# Patient Record
Sex: Female | Born: 1945 | Race: White | Hispanic: No | Marital: Married | Smoking: Never smoker
Health system: Southern US, Community
[De-identification: ages and names within clinical notes are randomized; demographics above are authoritative.]

## PROBLEM LIST (undated history)

## (undated) DIAGNOSIS — E039 Hypothyroidism, unspecified: Secondary | ICD-10-CM

## (undated) DIAGNOSIS — L509 Urticaria, unspecified: Secondary | ICD-10-CM

## (undated) DIAGNOSIS — B019 Varicella without complication: Secondary | ICD-10-CM

## (undated) DIAGNOSIS — K635 Polyp of colon: Secondary | ICD-10-CM

## (undated) DIAGNOSIS — L80 Vitiligo: Secondary | ICD-10-CM

## (undated) DIAGNOSIS — J45909 Unspecified asthma, uncomplicated: Secondary | ICD-10-CM

## (undated) HISTORY — DX: Hypothyroidism, unspecified: E03.9

## (undated) HISTORY — DX: Vitiligo: L80

## (undated) HISTORY — DX: Unspecified asthma, uncomplicated: J45.909

## (undated) HISTORY — PX: BREAST CYST ASPIRATION: SHX578

## (undated) HISTORY — PX: TONSILLECTOMY: SUR1361

## (undated) HISTORY — DX: Varicella without complication: B01.9

## (undated) HISTORY — DX: Urticaria, unspecified: L50.9

## (undated) HISTORY — DX: Polyp of colon: K63.5

---
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
## 1986-07-10 HISTORY — PX: BREAST BIOPSY: SHX20

## 1991-07-11 HISTORY — PX: PARTIAL HYSTERECTOMY: SHX80

## 2016-12-29 DIAGNOSIS — B351 Tinea unguium: Secondary | ICD-10-CM | POA: Diagnosis not present

## 2017-01-02 DIAGNOSIS — H524 Presbyopia: Secondary | ICD-10-CM | POA: Diagnosis not present

## 2017-02-05 DIAGNOSIS — L603 Nail dystrophy: Secondary | ICD-10-CM | POA: Diagnosis not present

## 2017-02-05 DIAGNOSIS — L821 Other seborrheic keratosis: Secondary | ICD-10-CM | POA: Diagnosis not present

## 2017-02-05 DIAGNOSIS — L718 Other rosacea: Secondary | ICD-10-CM | POA: Diagnosis not present

## 2017-02-06 NOTE — Progress Notes (Signed)
[place] at [facility] [address], [place], [place] [address] [phone]  Date:  02/08/2017   Name:  Maureen Obrien   DOB:  11-Apr-1946   MRN:  [id number]  PCP:  Darreld Mclean, MD    Chief Complaint: Establish Care (Pt here to est care. )   History of Present Illness:  Maureen Obrien is a 71 y.o. very pleasant female patient who presents with the following:  Here today as a new patient to my practice.  It looks like she had been a pt in [place] in the past- Dr. Teressa Lower.  Last OV with him about a year ago as follows:  ASSESSMENT/PLAN   1. Wellness examination - OV: routine/healthy lifestyle/cancer screening/vaccination education. Testing and referrals as noted. Repeat evalution yearly.   2. Screening for colon cancer - OV: routine education. increased risk. no symptoms. normal examination. Colonoscopy 2018. Recheck yearly.   3. Screening for breast cancer - OV: routine education. Risk: increased. Symptoms: no. Examination: normal. Mammogram due: now. Recheck yearly.   4. Screening for cervical cancer - N/A  5. Screening for osteoporosis - OV: routine education. Risk factor review. Maintain adequate calcium and vitamin D supplementation. Report broken bones. Bone density testing every 1-2 years, due now   6. Screening for lipid disorders - OV: report pending. Traditional goal for you is LDL 100 and HDL 50 but we are transitioning to a risk based approach for patients from ages 67-75. For these patients, we will also consider the calculated 10 risk of stroke and heart attack and the results of this calculation may lead to a recommendation for medication. Routine/medication/diet/exercise/weight maintenance education. If at goal, no changes and retest in TBD months.   7. Screening for diabetes mellitus (DM) - OV: at goal (fasting sugar less than 110) with sugar today pending. Routine education. Continue to  monitor.   8. Postmenopausal syndrome - OV: ongoing need for hormone replacement. routine education. Adverse effects reviewed. Elects to continue current dose with recheck 1 year.   9. Hypothyroidism, unspecified type - OV: thyroid dose seems to be correct based on symptoms and examination. Routine education. No changes. Retest now and recheck 12 months.   10. Varicose veins of both lower extremities - OV: evident both legs, but no symptoms - compression hose for now - consider vein clinic if worsens or symptoms  11. Vitiligo - OV: refer to dermatologist to continue care    [place]   Chief Complaint  Patient presents with  . Annual Exam   HPI   Current wellness status: - overall well, chronic diseases stable and /or routine care up to date, except for needing derm referral. - other wellness providers: none  Health maintenance status: Health Maintenance  Topic Date Due  . Medicare Annual Wellness Visit 11/23/1965  . MAMMOGRAM 11/23/1997  . PNEUMOVAX 11/23/2012  . INFLUENZA VACCINE 03/10/2016  . COLONOSCOPY 12/23/2023   Cancer screen status: - last mammogram: 2016 next due: now - last PAP smear: 2016 next due: na - last colonoscopy: 2015 next due: 2018  Vaccination status: - reviewed with patient Immunization History  Administered Date(s) Administered  . Pneumococcal Conjugate 13-valent - PREVNAR 01/21/2015  . Tdap 60/04/9322   HRT - uncertain reason for progestin, states feels better and is more balanced - states understands risk of HRT and breast cancer but wants to continue anyway - - THYROID - Notable interim history: none - Symptoms  overactive thyroid: None - Symptoms underactive thyroid: None - Medications: medication list: reviewed; compliance: good; adverse effects: none.   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 She understands that HRT does come with risks of several diseases- however she does not wish to stop using her HRT  No history of blood clot, CVA, heart issues or cancers  There is some family history of breast cancer- 2 of her younger sisters died of breast cancer, and her 2 brothers have had skin cancer (they live in [place] and have been exposed to heavy sun) She keeps up with her mammograms  She gets a colonoscopy every couple of years due to polyps- she has a GI doctor in [place] who she will continue to see She will continue getting her mammos in [place] as well Her last labs were in January   She would like to lose a little weight and wants to exercise more She has never been a smoker She does yoga and pilates.  She has been working a lot right now so she is not exercising as much  Her son and daughter live in [place] and [place]- she has 4 grands, between 44 and 29 yo   There are no active problems to display for this patient.   Past Medical History:  Diagnosis Date  . Asthma   . Chicken pox   . Colon polyps   . Hypothyroidism     Past Surgical History:  Procedure Laterality Date  . BREAST BIOPSY  1988  . PARTIAL HYSTERECTOMY  1993  . TONSILLECTOMY     childhood    Social History  Substance Use Topics  . Smoking status: Never Smoker  . Smokeless tobacco: Never Used  . Alcohol use Yes    Family History  Problem Relation Age of Onset  . Pancreatic cancer Mother   . Aneurysm Father   . Breast cancer Sister   . Skin cancer Brother   . Heart attack Brother   . Breast cancer Sister   . Hepatitis C Brother   . Skin cancer Brother     Allergies  Allergen Reactions  . Eggs Or Egg-Derived Products Other  (See Comments)  . Penicillin G Other (See Comments)    States since allergic to eggs can not use this    Medication list has been reviewed and updated.  No current outpatient prescriptions on file prior to visit.   No current facility-administered medications on file prior to visit.     Review of Systems:  As per HPI- otherwise negative.   Physical Examination: Vitals:   02/08/17 0945  BP: 101/78  Pulse: 73  Temp: 98.1 F (36.7 C)   Vitals:   02/08/17 0945  Weight: 147 lb 12.8 oz (67 kg)  Height: 5' 2.5" (1.588 m)   Body mass index is 26.6 kg/m. Ideal Body Weight: Weight in (lb) to have BMI = 25: 138.6  GEN: WDWN, NAD, Non-toxic, A & O x 3, mild overweight, looks well HEENT: Atraumatic, Normocephalic. Neck supple. No masses, No LAD. Bilateral TM  wnl, oropharynx normal.  PEERL,EOMI.   Ears and Nose: No external deformity. CV: RRR, No M/G/R. No JVD. No thrill. No extra heart sounds. PULM: CTA B, no wheezes, crackles, rhonchi. No retractions. No resp. distress. No accessory muscle use. EXTR: No c/c/e NEURO Normal gait.  PSYCH: Normally interactive. Conversant. Not depressed or anxious appearing.  Calm demeanor.    Assessment and Plan: Hypothyroidism (acquired)  Postmenopausal syndrome  Vitiligo  Here today to establish care She will see me for a physical in a few months  Here today to establish care as a new patient   Signed Lamar Blinks, MD

## 2017-02-07 ENCOUNTER — Telehealth: Payer: Self-pay | Admitting: Behavioral Health

## 2017-02-07 NOTE — Telephone Encounter (Signed)
Unable to reach patient at time of Pre-Visit Call.  Left message for patient to return call when available.    

## 2017-02-08 ENCOUNTER — Encounter: Payer: Self-pay | Admitting: Family Medicine

## 2017-02-08 ENCOUNTER — Ambulatory Visit (INDEPENDENT_AMBULATORY_CARE_PROVIDER_SITE_OTHER): Payer: PPO | Admitting: Family Medicine

## 2017-02-08 VITALS — BP 101/78 | HR 73 | Temp 98.1°F | Ht 62.5 in | Wt 147.8 lb

## 2017-02-08 DIAGNOSIS — N951 Menopausal and female climacteric states: Secondary | ICD-10-CM

## 2017-02-08 DIAGNOSIS — E039 Hypothyroidism, unspecified: Secondary | ICD-10-CM

## 2017-02-08 DIAGNOSIS — L80 Vitiligo: Secondary | ICD-10-CM | POA: Diagnosis not present

## 2017-02-08 NOTE — Patient Instructions (Addendum)
It was good to see you today- take care and please come and see me around the new year.  We can do your labs and physical at that time.

## 2017-03-09 DIAGNOSIS — M62830 Muscle spasm of back: Secondary | ICD-10-CM | POA: Diagnosis not present

## 2017-03-09 DIAGNOSIS — M545 Low back pain: Secondary | ICD-10-CM | POA: Diagnosis not present

## 2017-03-09 DIAGNOSIS — M9902 Segmental and somatic dysfunction of thoracic region: Secondary | ICD-10-CM | POA: Diagnosis not present

## 2017-03-09 DIAGNOSIS — M9903 Segmental and somatic dysfunction of lumbar region: Secondary | ICD-10-CM | POA: Diagnosis not present

## 2017-03-09 DIAGNOSIS — M542 Cervicalgia: Secondary | ICD-10-CM | POA: Diagnosis not present

## 2017-03-09 DIAGNOSIS — M9901 Segmental and somatic dysfunction of cervical region: Secondary | ICD-10-CM | POA: Diagnosis not present

## 2017-03-22 ENCOUNTER — Encounter: Payer: Self-pay | Admitting: Family Medicine

## 2017-04-05 ENCOUNTER — Other Ambulatory Visit: Payer: Self-pay | Admitting: Family Medicine

## 2017-04-16 ENCOUNTER — Other Ambulatory Visit: Payer: Self-pay | Admitting: Family Medicine

## 2017-04-16 DIAGNOSIS — Z86010 Personal history of colonic polyps: Secondary | ICD-10-CM | POA: Diagnosis not present

## 2017-04-16 DIAGNOSIS — Z1211 Encounter for screening for malignant neoplasm of colon: Secondary | ICD-10-CM | POA: Diagnosis not present

## 2017-04-16 DIAGNOSIS — Z1231 Encounter for screening mammogram for malignant neoplasm of breast: Secondary | ICD-10-CM

## 2017-04-17 ENCOUNTER — Encounter (HOSPITAL_BASED_OUTPATIENT_CLINIC_OR_DEPARTMENT_OTHER): Payer: Self-pay

## 2017-04-17 ENCOUNTER — Ambulatory Visit (HOSPITAL_BASED_OUTPATIENT_CLINIC_OR_DEPARTMENT_OTHER)
Admission: RE | Admit: 2017-04-17 | Discharge: 2017-04-17 | Disposition: A | Discharge status: Home / Self Care | Payer: PPO | Source: Ambulatory Visit | Attending: Family Medicine | Admitting: Family Medicine

## 2017-04-17 DIAGNOSIS — Z1231 Encounter for screening mammogram for malignant neoplasm of breast: Secondary | ICD-10-CM | POA: Diagnosis not present

## 2017-05-10 DIAGNOSIS — L578 Other skin changes due to chronic exposure to nonionizing radiation: Secondary | ICD-10-CM | POA: Diagnosis not present

## 2017-05-10 DIAGNOSIS — L718 Other rosacea: Secondary | ICD-10-CM | POA: Diagnosis not present

## 2017-05-19 DIAGNOSIS — J189 Pneumonia, unspecified organism: Secondary | ICD-10-CM | POA: Diagnosis not present

## 2017-06-04 DIAGNOSIS — M1811 Unilateral primary osteoarthritis of first carpometacarpal joint, right hand: Secondary | ICD-10-CM | POA: Diagnosis not present

## 2017-06-04 DIAGNOSIS — M79641 Pain in right hand: Secondary | ICD-10-CM | POA: Diagnosis not present

## 2017-06-05 DIAGNOSIS — R05 Cough: Secondary | ICD-10-CM | POA: Diagnosis not present

## 2017-06-21 DIAGNOSIS — E039 Hypothyroidism, unspecified: Secondary | ICD-10-CM | POA: Diagnosis not present

## 2017-06-21 DIAGNOSIS — R5383 Other fatigue: Secondary | ICD-10-CM | POA: Diagnosis not present

## 2017-06-21 DIAGNOSIS — E349 Endocrine disorder, unspecified: Secondary | ICD-10-CM | POA: Diagnosis not present

## 2017-06-21 DIAGNOSIS — N951 Menopausal and female climacteric states: Secondary | ICD-10-CM | POA: Diagnosis not present

## 2017-06-21 LAB — BASIC METABOLIC PANEL
BUN: 12 (ref 4–21)
Creatinine: 0.5 (ref <=1.1)
GLUCOSE: 105
Potassium: 4.4 (ref 3.4–5.3)
Sodium: 137 (ref 137–147)

## 2017-06-21 LAB — HEPATIC FUNCTION PANEL
ALK PHOS: 58 (ref 25–125)
ALT: 33 (ref 7–35)
AST: 22 (ref 13–35)

## 2017-06-21 LAB — CBC AND DIFFERENTIAL
HEMATOCRIT: 38 (ref 36–46)
HEMOGLOBIN: 12.7 (ref 12.0–16.0)
PLATELETS: 292 (ref 150–399)

## 2017-08-11 NOTE — Progress Notes (Signed)
[place] at [facility] [place], [place], [place] [address] [phone]  Date:  08/13/2017   Name:  Maureen Obrien   DOB:  19-Jan-1946   MRN:  [id number]  PCP:  Darreld Mclean, MD    Chief Complaint: Annual Exam (Pt here for CPE)   History of Present Illness:  Maureen Obrien is a 72 y.o. very pleasant female patient who presents with the following:  Here for a CPE today I saw her as a new pt in August:  She is on thyroid medication and is stable on her current dose She had a partial hyst (has her ovaries) due to fibroids in the past  Never had an abnl pap She is on estrogen and progesterone HRT and has been on these for a long time. She understands that HRT does come with risks of several diseases- however she does not wish to stop using her HRT No history of blood clot, CVA, heart issues or cancers There is some family history of breast cancer- 2 of her younger sisters died of breast cancer, and her 2 brothers have had skin cancer (they live in [place] and have been exposed to heavy sun) She keeps up with her mammograms  She gets a colonoscopy every couple of years due to polyps- she has a GI doctor in [place] who she will continue to see She will continue getting her mammos in [place] as well Her last labs were in January  She would like to lose a little weight and wants to exercise more She has never been a smoker She does yoga and pilates.  She has been working a lot right now so she is not exercising as much Her son and daughter live in [place] and [place]- she has 4 grands, between 29 and 60 yo  Hep C: due Bone density: about 2 years ago Flu: declines Mammo: 10/18 tdap 12/08/2009 Colon: late 2018, negative. She got a 5 year recall this time  Pneumonia:  Complete Shingles: not done yet   She had a little breakfast this am- we had planned to do labs for her today, but she actually had these done with  a doctor who she sees in [place] in the last few months.  She manages her thyroid and hormones. We are not sure exactly what labs she had, but we did a records request and will review prior to doing new BW today  She did get walking pneumonia over the winter- was seen and treated at [facility] She has not gotten her flu shot- she is allergic to eggs and does not want to have the shot even if we do have egg free  She has not been exercising as much due to being busy with her family, etc She is a bit upset that she has not lost any weight, but she also did not gain over the holidays which is good   She has no CP or SOB with activity Overall she feels like she is doing very well and has no concerns today No breast concerns  Wt Readings from Last 3 Encounters:  08/13/17 147 lb (66.7 kg)  02/08/17 147 lb 12.8 oz (67 kg)     There are no active problems to display for this patient.   Past Medical History:  Diagnosis Date  . Asthma   . Chicken pox   . Colon polyps   . Hypothyroidism     Past  Surgical History:  Procedure Laterality Date  . BREAST BIOPSY  1988  . PARTIAL HYSTERECTOMY  1993  . TONSILLECTOMY     childhood    Social History   Tobacco Use  . Smoking status: Never Smoker  . Smokeless tobacco: Never Used  Substance Use Topics  . Alcohol use: Yes  . Drug use: Not on file    Family History  Problem Relation Age of Onset  . Pancreatic cancer Mother   . Aneurysm Father   . Breast cancer Sister   . Skin cancer Brother   . Heart attack Brother   . Breast cancer Sister   . Hepatitis C Brother   . Skin cancer Brother     Allergies  Allergen Reactions  . Eggs Or Egg-Derived Products Other (See Comments)  . Penicillin G Other (See Comments)    States since allergic to eggs can not use this    Medication list has been reviewed and updated.  Current Outpatient Medications on File Prior to Visit  Medication Sig Dispense Refill  . estradiol (ESTRACE) 2 MG tablet  Take 2 mg by mouth.    . metroNIDAZOLE (METROCREAM) 0.75 % cream Use as directed twice daily    . progesterone (PROMETRIUM) 200 MG capsule Take 1 capsule by mouth daily.    Mar[place] Kitchen thyroid (ARMOUR THYROID) 120 MG tablet TAKE 1 TABLET BY MOUTH DAILY IN THE MORNING     No current facility-administered medications on file prior to visit.     Review of Systems:  As per HPI- otherwise negative.   Physical Examination: Vitals:   08/13/17 0935  BP: 126/84  Pulse: 96  Temp: 98.2 F (36.8 C)  SpO2: 98%   Vitals:   08/13/17 0935  Weight: 147 lb (66.7 kg)  Height: 5\' 3"  (1.6 m)   Body mass index is 26.04 kg/m. Ideal Body Weight: Weight in (lb) to have BMI = 25: 140.8  GEN: WDWN, NAD, Non-toxic, A & O x 3, mild overweight, looks well today HEENT: Atraumatic, Normocephalic. Neck supple. No masses, No LAD.  Bilateral TM wnl, oropharynx normal.  PEERL,EOMI.   Ears and Nose: No external deformity. CV: RRR, No M/G/R. No JVD. No thrill. No extra heart sounds. PULM: CTA B, no wheezes, crackles, rhonchi. No retractions. No resp. distress. No accessory muscle use. ABD: S, NT, ND, +BS. No rebound. No HSM. EXTR: No c/c/e NEURO Normal gait.  PSYCH: Normally interactive. Conversant. Not depressed or anxious appearing.  Calm demeanor.  Normal strength and DTR of all extremities She is wearing a right thumb brace per DR. Gramig   Assessment and Plan: Physical exam  Hypothyroidism (acquired)  Screening for diabetes mellitus  Screening for hyperlipidemia  Medication monitoring encounter  Estrogen deficiency - Plan: DG Bone Density  CPE today- she thinks that she is caught up on labs, will request from her other doctor in [place] and then supplement as needed Encouraged exercise She is trying the "keto" diet and has lost a few labs, ok to continue this for now Encouraged shingles vaccine Health maint material provided   Signed Lamar Blinks, MD

## 2017-08-13 ENCOUNTER — Ambulatory Visit (INDEPENDENT_AMBULATORY_CARE_PROVIDER_SITE_OTHER): Payer: PPO | Admitting: Family Medicine

## 2017-08-13 ENCOUNTER — Encounter: Payer: Self-pay | Admitting: Family Medicine

## 2017-08-13 VITALS — BP 126/84 | HR 96 | Temp 98.2°F | Ht 63.0 in | Wt 147.0 lb

## 2017-08-13 DIAGNOSIS — Z0000 Encounter for general adult medical examination without abnormal findings: Secondary | ICD-10-CM

## 2017-08-13 DIAGNOSIS — Z5181 Encounter for therapeutic drug level monitoring: Secondary | ICD-10-CM | POA: Diagnosis not present

## 2017-08-13 DIAGNOSIS — Z131 Encounter for screening for diabetes mellitus: Secondary | ICD-10-CM | POA: Diagnosis not present

## 2017-08-13 DIAGNOSIS — E2839 Other primary ovarian failure: Secondary | ICD-10-CM | POA: Diagnosis not present

## 2017-08-13 DIAGNOSIS — E039 Hypothyroidism, unspecified: Secondary | ICD-10-CM | POA: Diagnosis not present

## 2017-08-13 DIAGNOSIS — Z13220 Encounter for screening for lipoid disorders: Secondary | ICD-10-CM

## 2017-08-13 NOTE — Patient Instructions (Addendum)
We will set you up for a bone density scan today We will request your recent labs form your doctor in [place] I would recommend that you have the Shingrix shingles vaccine series at your convenience   Health Maintenance for Postmenopausal Women Menopause is a normal process in which your reproductive ability comes to an end. This process happens gradually over a span of months to years, usually between the ages of 58 and 14. Menopause is complete when you have missed 12 consecutive menstrual periods. It is important to talk with your health care provider about some of the most common conditions that affect postmenopausal women, such as heart disease, cancer, and bone loss (osteoporosis). Adopting a healthy lifestyle and getting preventive care can help to promote your health and wellness. Those actions can also lower your chances of developing some of these common conditions. What should I know about menopause? During menopause, you may experience a number of symptoms, such as:  Moderate-to-severe hot flashes.  Night sweats.  Decrease in sex drive.  Mood swings.  Headaches.  Tiredness.  Irritability.  Memory problems.  Insomnia.  Choosing to treat or not to treat menopausal changes is an individual decision that you make with your health care provider. What should I know about hormone replacement therapy and supplements? Hormone therapy products are effective for treating symptoms that are associated with menopause, such as hot flashes and night sweats. Hormone replacement carries certain risks, especially as you become older. If you are thinking about using estrogen or estrogen with progestin treatments, discuss the benefits and risks with your health care provider. What should I know about heart disease and stroke? Heart disease, heart attack, and stroke become more likely as you age. This may be due, in part, to the hormonal changes that your body experiences during menopause. These  can affect how your body processes dietary fats, triglycerides, and cholesterol. Heart attack and stroke are both medical emergencies. There are many things that you can do to help prevent heart disease and stroke:  Have your blood pressure checked at least every 1-2 years. High blood pressure causes heart disease and increases the risk of stroke.  If you are 97-45 years old, ask your health care provider if you should take aspirin to prevent a heart attack or a stroke.  Do not use any tobacco products, including cigarettes, chewing tobacco, or electronic cigarettes. If you need help quitting, ask your health care provider.  It is important to eat a healthy diet and maintain a healthy weight. ? Be sure to include plenty of vegetables, fruits, low-fat dairy products, and lean protein. ? Avoid eating foods that are high in solid fats, added sugars, or salt (sodium).  Get regular exercise. This is one of the most important things that you can do for your health. ? Try to exercise for at least 150 minutes each week. The type of exercise that you do should increase your heart rate and make you sweat. This is known as moderate-intensity exercise. ? Try to do strengthening exercises at least twice each week. Do these in addition to the moderate-intensity exercise.  Know your numbers.Ask your health care provider to check your cholesterol and your blood glucose. Continue to have your blood tested as directed by your health care provider.  What should I know about cancer screening? There are several types of cancer. Take the following steps to reduce your risk and to catch any cancer development as early as possible. Breast Cancer  Practice breast  self-awareness. ? This means understanding how your breasts normally appear and feel. ? It also means doing regular breast self-exams. Let your health care provider know about any changes, no matter how small.  If you are 19 or older, have a clinician do  a breast exam (clinical breast exam or CBE) every year. Depending on your age, family history, and medical history, it may be recommended that you also have a yearly breast X-ray (mammogram).  If you have a family history of breast cancer, talk with your health care provider about genetic screening.  If you are at high risk for breast cancer, talk with your health care provider about having an MRI and a mammogram every year.  Breast cancer (BRCA) gene test is recommended for women who have family members with BRCA-related cancers. Results of the assessment will determine the need for genetic counseling and BRCA1 and for BRCA2 testing. BRCA-related cancers include these types: ? Breast. This occurs in males or females. ? Ovarian. ? Tubal. This may also be called fallopian tube cancer. ? Cancer of the abdominal or pelvic lining (peritoneal cancer). ? Prostate. ? Pancreatic.  Cervical, Uterine, and Ovarian Cancer Your health care provider may recommend that you be screened regularly for cancer of the pelvic organs. These include your ovaries, uterus, and vagina. This screening involves a pelvic exam, which includes checking for microscopic changes to the surface of your cervix (Pap test).  For women ages 21-65, health care providers may recommend a pelvic exam and a Pap test every three years. For women ages 10-65, they may recommend the Pap test and pelvic exam, combined with testing for human papilloma virus (HPV), every five years. Some types of HPV increase your risk of cervical cancer. Testing for HPV may also be done on women of any age who have unclear Pap test results.  Other health care providers may not recommend any screening for nonpregnant women who are considered low risk for pelvic cancer and have no symptoms. Ask your health care provider if a screening pelvic exam is right for you.  If you have had past treatment for cervical cancer or a condition that could lead to cancer, you  need Pap tests and screening for cancer for at least 20 years after your treatment. If Pap tests have been discontinued for you, your risk factors (such as having a new sexual partner) need to be reassessed to determine if you should start having screenings again. Some women have medical problems that increase the chance of getting cervical cancer. In these cases, your health care provider may recommend that you have screening and Pap tests more often.  If you have a family history of uterine cancer or ovarian cancer, talk with your health care provider about genetic screening.  If you have vaginal bleeding after reaching menopause, tell your health care provider.  There are currently no reliable tests available to screen for ovarian cancer.  Lung Cancer Lung cancer screening is recommended for adults 19-9 years old who are at high risk for lung cancer because of a history of smoking. A yearly low-dose CT scan of the lungs is recommended if you:  Currently smoke.  Have a history of at least 30 pack-years of smoking and you currently smoke or have quit within the past 15 years. A pack-year is smoking an average of one pack of cigarettes per day for one year.  Yearly screening should:  Continue until it has been 15 years since you quit.  Stop if  you develop a health problem that would prevent you from having lung cancer treatment.  Colorectal Cancer  This type of cancer can be detected and can often be prevented.  Routine colorectal cancer screening usually begins at age 64 and continues through age 67.  If you have risk factors for colon cancer, your health care provider may recommend that you be screened at an earlier age.  If you have a family history of colorectal cancer, talk with your health care provider about genetic screening.  Your health care provider may also recommend using home test kits to check for hidden blood in your stool.  A small camera at the end of a tube can be  used to examine your colon directly (sigmoidoscopy or colonoscopy). This is done to check for the earliest forms of colorectal cancer.  Direct examination of the colon should be repeated every 5-10 years until age 24. However, if early forms of precancerous polyps or small growths are found or if you have a family history or genetic risk for colorectal cancer, you may need to be screened more often.  Skin Cancer  Check your skin from head to toe regularly.  Monitor any moles. Be sure to tell your health care provider: ? About any new moles or changes in moles, especially if there is a change in a mole's shape or color. ? If you have a mole that is larger than the size of a pencil eraser.  If any of your family members has a history of skin cancer, especially at a young age, talk with your health care provider about genetic screening.  Always use sunscreen. Apply sunscreen liberally and repeatedly throughout the day.  Whenever you are outside, protect yourself by wearing long sleeves, pants, a wide-brimmed hat, and sunglasses.  What should I know about osteoporosis? Osteoporosis is a condition in which bone destruction happens more quickly than new bone creation. After menopause, you may be at an increased risk for osteoporosis. To help prevent osteoporosis or the bone fractures that can happen because of osteoporosis, the following is recommended:  If you are 10-7 years old, get at least 1,000 mg of calcium and at least 600 mg of vitamin D per day.  If you are older than age 67 but younger than age 43, get at least 1,200 mg of calcium and at least 600 mg of vitamin D per day.  If you are older than age 102, get at least 1,200 mg of calcium and at least 800 mg of vitamin D per day.  Smoking and excessive alcohol intake increase the risk of osteoporosis. Eat foods that are rich in calcium and vitamin D, and do weight-bearing exercises several times each week as directed by your health care  provider. What should I know about how menopause affects my mental health? Depression may occur at any age, but it is more common as you become older. Common symptoms of depression include:  Low or sad mood.  Changes in sleep patterns.  Changes in appetite or eating patterns.  Feeling an overall lack of motivation or enjoyment of activities that you previously enjoyed.  Frequent crying spells.  Talk with your health care provider if you think that you are experiencing depression. What should I know about immunizations? It is important that you get and maintain your immunizations. These include:  Tetanus, diphtheria, and pertussis (Tdap) booster vaccine.  Influenza every year before the flu season begins.  Pneumonia vaccine.  Shingles vaccine.  Your health care  provider may also recommend other immunizations. This information is not intended to replace advice given to you by your health care provider. Make sure you discuss any questions you have with your health care provider. Document Released: 08/18/2005 Document Revised: 01/14/2016 Document Reviewed: 03/30/2015 Elsevier Interactive Patient Education  2018 [employer].

## 2017-08-22 ENCOUNTER — Telehealth: Payer: Self-pay | Admitting: Family Medicine

## 2017-08-22 NOTE — Telephone Encounter (Signed)
Received records from Dr. Belva Chimes, [facility].  Will abstract and scan as needed

## 2017-08-23 ENCOUNTER — Other Ambulatory Visit: Payer: Self-pay

## 2017-08-28 DIAGNOSIS — L718 Other rosacea: Secondary | ICD-10-CM | POA: Diagnosis not present

## 2017-10-02 NOTE — Progress Notes (Deleted)
[place] at [facility] [address] [place], [place] [address] [phone]  Date:  10/03/2017   Name:  Maureen Obrien   DOB:  1946-02-23   MRN:  [id number]  PCP:  Darreld Mclean, MD    Chief Complaint: No chief complaint on file.   History of Present Illness:  Maureen Obrien is a 72 y.o. very pleasant female patient who presents with the following:  Generally healthy woman here today with concern of   She does have concern of hypothyroidism, on armour   Flu? Hep C screening needed Dexa?    There are no active problems to display for this patient.   Past Medical History:  Diagnosis Date  . Asthma   . Chicken pox   . Colon polyps   . Hypothyroidism     Past Surgical History:  Procedure Laterality Date  . BREAST BIOPSY  1988  . PARTIAL HYSTERECTOMY  1993  . TONSILLECTOMY     childhood    Social History   Tobacco Use  . Smoking status: Never Smoker  . Smokeless tobacco: Never Used  Substance Use Topics  . Alcohol use: Yes  . Drug use: Not on file    Family History  Problem Relation Age of Onset  . Pancreatic cancer Mother   . Aneurysm Father   . Breast cancer Sister   . Skin cancer Brother   . Heart attack Brother   . Breast cancer Sister   . Hepatitis C Brother   . Skin cancer Brother     Allergies  Allergen Reactions  . Eggs Or Egg-Derived Products Other (See Comments)  . Penicillin G Other (See Comments)    States since allergic to eggs can not use this    Medication list has been reviewed and updated.  Current Outpatient Medications on File Prior to Visit  Medication Sig Dispense Refill  . estradiol (ESTRACE) 2 MG tablet Take 2 mg by mouth.    . metroNIDAZOLE (METROCREAM) 0.75 % cream Use as directed twice daily    . progesterone (PROMETRIUM) 200 MG capsule Take 1 capsule by mouth daily.    Marland Kitchen thyroid (ARMOUR THYROID) 120 MG tablet TAKE 1 TABLET BY MOUTH DAILY IN THE  MORNING     No current facility-administered medications on file prior to visit.     Review of Systems:  As per HPI- otherwise negative.   Physical Examination: There were no vitals filed for this visit. There were no vitals filed for this visit. There is no height or weight on file to calculate BMI. Ideal Body Weight:    GEN: WDWN, NAD, Non-toxic, A & O x 3 HEENT: Atraumatic, Normocephalic. Neck supple. No masses, No LAD. Ears and Nose: No external deformity. CV: RRR, No M/G/R. No JVD. No thrill. No extra heart sounds. PULM: CTA B, no wheezes, crackles, rhonchi. No retractions. No resp. distress. No accessory muscle use. ABD: S, NT, ND, +BS. No rebound. No HSM. EXTR: No c/c/e NEURO Normal gait.  PSYCH: Normally interactive. Conversant. Not depressed or anxious appearing.  Calm demeanor.    Assessment and Plan: ***  Signed Lamar Blinks, MD

## 2017-10-03 ENCOUNTER — Ambulatory Visit (INDEPENDENT_AMBULATORY_CARE_PROVIDER_SITE_OTHER): Payer: PPO | Admitting: Family Medicine

## 2017-10-03 ENCOUNTER — Ambulatory Visit: Payer: PPO | Admitting: Family Medicine

## 2017-10-03 VITALS — BP 142/86 | HR 94 | Temp 98.0°F | Wt 148.4 lb

## 2017-10-03 DIAGNOSIS — R2241 Localized swelling, mass and lump, right lower limb: Secondary | ICD-10-CM | POA: Diagnosis not present

## 2017-10-03 NOTE — Progress Notes (Signed)
[place] at [facility] [address] [place], [place] [address] [phone]  Date:  10/03/2017   Name:  Maureen Obrien   DOB:  08-29-1945   MRN:  [id number]  PCP:  Darreld Mclean, MD    Chief Complaint: Mass (c/o lump on right foot. Pt is not sure how long it has been present. Denies any pain. )   History of Present Illness:  Maureen Obrien is a 72 y.o. very pleasant female patient who presents with the following:  Here today with concern about a bump on the dorsum of her her foot-  She is not sure, but thinks it may have been there for close to a year It does not hurt She is not sure if it is getting bigger-has not really noticed, but started to get concerned that it could be something significant  Last seen here about 7 weeks ago for a CPE - doing well overall  There are no active problems to display for this patient.   Past Medical History:  Diagnosis Date  . Asthma   . Chicken pox   . Colon polyps   . Hypothyroidism     Past Surgical History:  Procedure Laterality Date  . BREAST BIOPSY  1988  . PARTIAL HYSTERECTOMY  1993  . TONSILLECTOMY     childhood    Social History   Tobacco Use  . Smoking status: Never Smoker  . Smokeless tobacco: Never Used  Substance Use Topics  . Alcohol use: Yes  . Drug use: Not on file    Family History  Problem Relation Age of Onset  . Pancreatic cancer Mother   . Aneurysm Father   . Breast cancer Sister   . Skin cancer Brother   . Heart attack Brother   . Breast cancer Sister   . Hepatitis C Brother   . Skin cancer Brother     Allergies  Allergen Reactions  . Eggs Or Egg-Derived Products Other (See Comments)  . Penicillin G Other (See Comments)    States since allergic to eggs can not use this    Medication list has been reviewed and updated.  Current Outpatient Medications on File Prior to Visit  Medication Sig Dispense Refill  . estradiol  (ESTRACE) 2 MG tablet Take 2 mg by mouth.    . metroNIDAZOLE (METROCREAM) 0.75 % cream Use as directed twice daily    . progesterone (PROMETRIUM) 200 MG capsule Take 1 capsule by mouth daily.    Marland Kitchen thyroid (ARMOUR THYROID) 120 MG tablet TAKE 1 TABLET BY MOUTH DAILY IN THE MORNING     No current facility-administered medications on file prior to visit.     Review of Systems:  As per HPI- otherwise negative. No fever or chills No other masses or bumps    Physical Examination: Vitals:   10/03/17 1224  BP: (!) 142/86  Pulse: 94  Temp: 98 F (36.7 C)  SpO2: 97%   Vitals:   10/03/17 1224  Weight: 148 lb 6.4 oz (67.3 kg)   Body mass index is 26.29 kg/m. Ideal Body Weight:     GEN: WDWN, NAD, Non-toxic, Alert & Oriented x 3 HEENT: Atraumatic, Normocephalic.  Ears and Nose: No external deformity. EXTR: No clubbing/cyanosis/edema NEURO: Normal gait.  PSYCH: Normally interactive. Conversant. Not depressed or anxious appearing.  Calm demeanor.  Right foot: overlying the dorsal 4th MT is a firm, small blueberry sized nodule. It is  non tender and not red or inflamed.  ?cyst but feels perhaps too firm  No tenderness of the foot The cyst does not seem to be attacked to any tendon as it does not move with motion of her toes    Assessment and Plan: Mass of right foot - Plan: Ambulatory referral to Orthopedic Surgery  Referral to ortho- they may want to remove this mass for her.  Do not think it is anything dangerous but we are not sure what it is   Signed Lamar Blinks, MD

## 2017-10-03 NOTE — Patient Instructions (Signed)
We will have you see ortho for the bump on your foot.  I do not think it is anything dangerous but certainly we will look at it for you.  Let me know if you don't get your ortho appt in a timely fashion or if any other concerns

## 2017-10-10 DIAGNOSIS — M67471 Ganglion, right ankle and foot: Secondary | ICD-10-CM | POA: Diagnosis not present

## 2017-10-17 DIAGNOSIS — J111 Influenza due to unidentified influenza virus with other respiratory manifestations: Secondary | ICD-10-CM | POA: Diagnosis not present

## 2017-10-26 DIAGNOSIS — J309 Allergic rhinitis, unspecified: Secondary | ICD-10-CM | POA: Diagnosis not present

## 2017-10-26 DIAGNOSIS — R5381 Other malaise: Secondary | ICD-10-CM | POA: Diagnosis not present

## 2017-10-26 DIAGNOSIS — H1045 Other chronic allergic conjunctivitis: Secondary | ICD-10-CM | POA: Diagnosis not present

## 2017-12-21 DIAGNOSIS — L821 Other seborrheic keratosis: Secondary | ICD-10-CM | POA: Diagnosis not present

## 2018-04-03 DIAGNOSIS — H524 Presbyopia: Secondary | ICD-10-CM | POA: Diagnosis not present

## 2018-04-03 DIAGNOSIS — H25813 Combined forms of age-related cataract, bilateral: Secondary | ICD-10-CM | POA: Diagnosis not present

## 2018-04-03 DIAGNOSIS — H04123 Dry eye syndrome of bilateral lacrimal glands: Secondary | ICD-10-CM | POA: Diagnosis not present

## 2018-04-18 DIAGNOSIS — M1811 Unilateral primary osteoarthritis of first carpometacarpal joint, right hand: Secondary | ICD-10-CM | POA: Diagnosis not present

## 2018-04-19 DIAGNOSIS — M1811 Unilateral primary osteoarthritis of first carpometacarpal joint, right hand: Secondary | ICD-10-CM | POA: Diagnosis not present

## 2018-06-19 DIAGNOSIS — D1801 Hemangioma of skin and subcutaneous tissue: Secondary | ICD-10-CM | POA: Diagnosis not present

## 2018-06-19 DIAGNOSIS — L570 Actinic keratosis: Secondary | ICD-10-CM | POA: Diagnosis not present

## 2018-06-19 DIAGNOSIS — I8391 Asymptomatic varicose veins of right lower extremity: Secondary | ICD-10-CM | POA: Diagnosis not present

## 2018-06-19 DIAGNOSIS — L821 Other seborrheic keratosis: Secondary | ICD-10-CM | POA: Diagnosis not present

## 2018-06-19 DIAGNOSIS — I8392 Asymptomatic varicose veins of left lower extremity: Secondary | ICD-10-CM | POA: Diagnosis not present

## 2018-07-17 DIAGNOSIS — L718 Other rosacea: Secondary | ICD-10-CM | POA: Diagnosis not present

## 2018-07-17 DIAGNOSIS — L0109 Other impetigo: Secondary | ICD-10-CM | POA: Diagnosis not present

## 2018-07-22 ENCOUNTER — Other Ambulatory Visit (HOSPITAL_BASED_OUTPATIENT_CLINIC_OR_DEPARTMENT_OTHER): Payer: Self-pay | Admitting: Family Medicine

## 2018-07-22 DIAGNOSIS — Z1231 Encounter for screening mammogram for malignant neoplasm of breast: Secondary | ICD-10-CM

## 2018-07-26 ENCOUNTER — Ambulatory Visit (HOSPITAL_BASED_OUTPATIENT_CLINIC_OR_DEPARTMENT_OTHER)
Admission: RE | Admit: 2018-07-26 | Discharge: 2018-07-26 | Disposition: A | Discharge status: Home / Self Care | Payer: PPO | Source: Ambulatory Visit | Attending: Family Medicine | Admitting: Family Medicine

## 2018-07-26 ENCOUNTER — Encounter: Payer: Self-pay | Admitting: Family Medicine

## 2018-07-26 DIAGNOSIS — Z1231 Encounter for screening mammogram for malignant neoplasm of breast: Secondary | ICD-10-CM | POA: Diagnosis not present

## 2018-07-26 DIAGNOSIS — E2839 Other primary ovarian failure: Secondary | ICD-10-CM

## 2018-07-26 DIAGNOSIS — M85852 Other specified disorders of bone density and structure, left thigh: Secondary | ICD-10-CM | POA: Diagnosis not present

## 2018-07-26 DIAGNOSIS — Z780 Asymptomatic menopausal state: Secondary | ICD-10-CM | POA: Diagnosis not present

## 2018-07-29 ENCOUNTER — Other Ambulatory Visit: Payer: Self-pay | Admitting: Family Medicine

## 2018-07-29 DIAGNOSIS — R928 Other abnormal and inconclusive findings on diagnostic imaging of breast: Secondary | ICD-10-CM

## 2018-07-30 ENCOUNTER — Telehealth: Payer: Self-pay | Admitting: *Deleted

## 2018-07-30 NOTE — Telephone Encounter (Signed)
Received Physician Orders from [place]; forwarded to provider/SLS 01/21

## 2018-08-02 ENCOUNTER — Ambulatory Visit: Payer: PPO

## 2018-08-02 ENCOUNTER — Ambulatory Visit
Admission: RE | Admit: 2018-08-02 | Discharge: 2018-08-02 | Disposition: A | Discharge status: Home / Self Care | Payer: PPO | Source: Ambulatory Visit | Attending: Family Medicine | Admitting: Family Medicine

## 2018-08-02 DIAGNOSIS — R928 Other abnormal and inconclusive findings on diagnostic imaging of breast: Secondary | ICD-10-CM

## 2018-08-02 DIAGNOSIS — R922 Inconclusive mammogram: Secondary | ICD-10-CM | POA: Diagnosis not present

## 2018-08-11 DIAGNOSIS — E039 Hypothyroidism, unspecified: Secondary | ICD-10-CM | POA: Insufficient documentation

## 2018-08-11 NOTE — Progress Notes (Addendum)
[place] at [facility] [place], [place], [place] [address] [phone]  Date:  08/14/2018   Name:  Maureen Obrien   DOB:  11-Sep-1945   MRN:  [id number]  PCP:  Darreld Mclean, MD    Chief Complaint: Annual Exam (declines flu shot)   History of Present Illness:  Maureen Obrien is a 73 y.o. very pleasant female patient who presents with the following:  Here today for complete physical History of hypothyroidism I saw her for a physical about 1 year ago, and then a month later for concern with her right foot-we sent her to orthopedics, and that that was a ganglion cyst.  She is status post hysterectomy due to fibroids She is on hormone replacement therapy She sees a doctor in [place] who manages her thyroid and hormone replacement therapy Her son and daughter live in [place] in [place], she has 4 grandchildren They are 11,9,7 and 5  Pap: Not indicated due to hysterectomy Mammogram: Done last month, normal Immunizations: she is allergic to eggs, but declines to have the egg free shot today Labs: she is not fasting, she ate buttered toast  Mentioned shingrix but she is not really interested at ths itime  Bone density: Last month, osteopenia Colon cancer screening: done per Dr. Melina Copa in [place], she was given 10 year follow-up.  I will request this report  She had a precancerous spot on her face treated per derm in [place]; healing slowly She is wearing a thumb brace on the right hand; she is seeing orthopedics for this issue  Maureen Obrien notes that overall she is doing well, but she is troubled by some of the common problems with aging such as skin skin.  She had been drinking up to a bottle of wine most days, and decided to to stop doing this about a month ago.  She does note that she is feeling better, and has lost few pounds.  Her sleep is also improved Patient Active Problem List   Diagnosis Date  Noted  . Hypothyroidism (acquired) 08/11/2018    Past Medical History:  Diagnosis Date  . Asthma   . Chicken pox   . Colon polyps   . Hypothyroidism     Past Surgical History:  Procedure Laterality Date  . BREAST BIOPSY  1988  . PARTIAL HYSTERECTOMY  1993  . TONSILLECTOMY     childhood    Social History   Tobacco Use  . Smoking status: Never Smoker  . Smokeless tobacco: Never Used  Substance Use Topics  . Alcohol use: Yes  . Drug use: Not on file    Family History  Problem Relation Age of Onset  . Pancreatic cancer Mother   . Aneurysm Father   . Breast cancer Sister   . Skin cancer Brother   . Heart attack Brother   . Breast cancer Sister   . Hepatitis C Brother   . Skin cancer Brother     Allergies  Allergen Reactions  . Eggs Or Egg-Derived Products Other (See Comments)  . Penicillin G Other (See Comments)    States since allergic to eggs can not use this    Medication list has been reviewed and updated.  Current Outpatient Medications on File Prior to Visit  Medication Sig Dispense Refill  . estradiol (ESTRACE) 2 MG tablet Take 2 mg by mouth.    . metroNIDAZOLE (METROCREAM) 0.75 % cream Use as directed  twice daily    . mupirocin ointment (BACTROBAN) 2 % APP EXT AA BID    . progesterone (PROMETRIUM) 200 MG capsule Take 1 capsule by mouth daily.    Marland Kitchen thyroid (ARMOUR THYROID) 120 MG tablet TAKE 1 TABLET BY MOUTH DAILY IN THE MORNING    . doxycycline (VIBRAMYCIN) 100 MG capsule TK 1 C PO BID WITH A FULL GLASS OF WATER     No current facility-administered medications on file prior to visit.     Review of Systems:  As per HPI- otherwise negative.   Physical Examination: Vitals:   08/14/18 1015  BP: 124/80  Pulse: (!) 101  Resp: 16  Temp: 97.9 F (36.6 C)  SpO2: 98%   Vitals:   08/14/18 1015  Weight: 148 lb (67.1 kg)  Height: 5\' 3"  (1.6 m)   Body mass index is 26.22 kg/m. Ideal Body Weight: Weight in (lb) to have BMI = 25: 140.8  GEN:  WDWN, NAD, Non-toxic, A & O x 3, mild overweight, looks well HEENT: Atraumatic, Normocephalic. Neck supple. No masses, No LAD.  Bilateral TM wnl, oropharynx normal.  PEERL,EOMI.   Ears and Nose: No external deformity. CV: RRR, No M/G/R. No JVD. No thrill. No extra heart sounds. PULM: CTA B, no wheezes, crackles, rhonchi. No retractions. No resp. distress. No accessory muscle use. ABD: S, NT, ND, +BS. No rebound. No HSM. EXTR: No c/c/e NEURO Normal gait.  PSYCH: Normally interactive. Conversant. Not depressed or anxious appearing.  Calm demeanor.  She has diffuse spider veins around both ankles  Wt Readings from Last 3 Encounters:  08/14/18 148 lb (67.1 kg)  10/03/17 148 lb 6.4 oz (67.3 kg)  08/13/17 147 lb (66.7 kg)    Assessment and Plan: Physical exam  Hypothyroidism (acquired)  Screening for diabetes mellitus - Plan: Comprehensive metabolic panel, Hemoglobin A1c  Screening for hyperlipidemia - Plan: Lipid panel  Medication monitoring encounter  Screening for deficiency anemia - Plan: CBC  Here today for physical exam.  Her thyroid and other hormone therapy is managed by another provider Screening labs as above, I will be in touch of these reports to right away Requested her most recent colonoscopy from her GI doctor She declines flu and Shingrix vaccines for now Praised her decreasing alcohol intake, we hope that she will continue to notice improvements of how she feels  Signed Lamar Blinks, MD  Received her labs as below, message to patient  Results for orders placed or performed in visit on 08/14/18  CBC  Result Value Ref Range   WBC 6.9 4.0 - 10.5 K/uL   RBC 4.35 3.87 - 5.11 Mil/uL   Platelets 282.0 150.0 - 400.0 K/uL   Hemoglobin 13.9 12.0 - 15.0 g/dL   HCT 41.7 36.0 - 46.0 %   MCV 95.7 78.0 - 100.0 fl   MCHC 33.3 30.0 - 36.0 g/dL   RDW 13.4 11.5 - 15.5 %  Comprehensive metabolic panel  Result Value Ref Range   Sodium 139 135 - 145 mEq/L   Potassium  5.6 (H) 3.5 - 5.1 mEq/L   Chloride 105 96 - 112 mEq/L   CO2 25 19 - 32 mEq/L   Glucose, Bld 84 70 - 99 mg/dL   BUN 20 6 - 23 mg/dL   Creatinine, Ser 0.65 0.40 - 1.20 mg/dL   Total Bilirubin 0.4 0.2 - 1.2 mg/dL   Alkaline Phosphatase 43 39 - 117 U/L   AST 28 0 - 37 U/L   ALT 55 (  H) 0 - 35 U/L   Total Protein 6.5 6.0 - 8.3 g/dL   Albumin 4.3 3.5 - 5.2 g/dL   Calcium 9.3 8.4 - 10.5 mg/dL   GFR 89.93 >60.00 mL/min  Hemoglobin A1c  Result Value Ref Range   Hgb A1c MFr Bld 5.2 4.6 - 6.5 %  Lipid panel  Result Value Ref Range   Cholesterol 185 0 - 200 mg/dL   Triglycerides 77.0 0.0 - 149.0 mg/dL   HDL 80.80 >39.00 mg/dL   VLDL 15.4 0.0 - 40.0 mg/dL   LDL Cholesterol 88 0 - 99 mg/dL   Total CHOL/HDL Ratio 2    NonHDL 103.77

## 2018-08-14 ENCOUNTER — Ambulatory Visit (INDEPENDENT_AMBULATORY_CARE_PROVIDER_SITE_OTHER): Payer: PPO | Admitting: Family Medicine

## 2018-08-14 ENCOUNTER — Other Ambulatory Visit: Payer: Self-pay | Admitting: Family Medicine

## 2018-08-14 ENCOUNTER — Encounter: Payer: Self-pay | Admitting: Family Medicine

## 2018-08-14 VITALS — BP 124/80 | HR 90 | Temp 97.9°F | Resp 16 | Ht 63.0 in | Wt 148.0 lb

## 2018-08-14 DIAGNOSIS — Z131 Encounter for screening for diabetes mellitus: Secondary | ICD-10-CM

## 2018-08-14 DIAGNOSIS — Z5181 Encounter for therapeutic drug level monitoring: Secondary | ICD-10-CM

## 2018-08-14 DIAGNOSIS — Z130 Encounter for screening for diseases of the blood and blood-forming organs and certain disorders involving the immune mechanism: Secondary | ICD-10-CM | POA: Diagnosis not present

## 2018-08-14 DIAGNOSIS — Z13220 Encounter for screening for lipoid disorders: Secondary | ICD-10-CM

## 2018-08-14 DIAGNOSIS — E039 Hypothyroidism, unspecified: Secondary | ICD-10-CM

## 2018-08-14 DIAGNOSIS — Z1159 Encounter for screening for other viral diseases: Secondary | ICD-10-CM

## 2018-08-14 DIAGNOSIS — E875 Hyperkalemia: Secondary | ICD-10-CM

## 2018-08-14 DIAGNOSIS — Z0000 Encounter for general adult medical examination without abnormal findings: Secondary | ICD-10-CM | POA: Diagnosis not present

## 2018-08-14 LAB — COMPREHENSIVE METABOLIC PANEL
ALT: 55 U/L — ABNORMAL HIGH (ref 0–35)
AST: 28 U/L (ref 0–37)
Albumin: 4.3 g/dL (ref 3.5–5.2)
Alkaline Phosphatase: 43 U/L (ref 39–117)
BUN: 20 mg/dL (ref 6–23)
CO2: 25 mEq/L (ref 19–32)
Calcium: 9.3 mg/dL (ref 8.4–10.5)
Chloride: 105 mEq/L (ref 96–112)
Creatinine, Ser: 0.65 mg/dL (ref 0.40–1.20)
GFR: 89.93 mL/min (ref >60.00)
Glucose, Bld: 84 mg/dL (ref 70–99)
Potassium: 5.6 mEq/L — ABNORMAL HIGH (ref 3.5–5.1)
SODIUM: 139 meq/L (ref 135–145)
Total Bilirubin: 0.4 mg/dL (ref 0.2–1.2)
Total Protein: 6.5 g/dL (ref 6.0–8.3)

## 2018-08-14 LAB — CBC
HCT: 41.7 % (ref 36.0–46.0)
Hemoglobin: 13.9 g/dL (ref 12.0–15.0)
MCHC: 33.3 g/dL (ref 30.0–36.0)
MCV: 95.7 fl (ref 78.0–100.0)
Platelets: 282 10*3/uL (ref 150.0–400.0)
RBC: 4.35 Mil/uL (ref 3.87–5.11)
RDW: 13.4 % (ref 11.5–15.5)
WBC: 6.9 10*3/uL (ref 4.0–10.5)

## 2018-08-14 LAB — LIPID PANEL
Cholesterol: 185 mg/dL (ref 0–200)
HDL: 80.8 mg/dL (ref >39.00)
LDL CALC: 88 mg/dL (ref 0–99)
NonHDL: 103.77
Total CHOL/HDL Ratio: 2
Triglycerides: 77 mg/dL (ref 0.0–149.0)
VLDL: 15.4 mg/dL (ref 0.0–40.0)

## 2018-08-14 LAB — HEMOGLOBIN A1C: Hgb A1c MFr Bld: 5.2 % (ref 4.6–6.5)

## 2018-08-14 NOTE — Patient Instructions (Addendum)
Vascular and vein specialists in [place] on [address] can help with your spider veins if you like I will be in touch with your labs  Blood pressure looks fine  Congratulations on stopping drinking, I hope you will continue to see benefits from this change   Health Maintenance After Age 71 After age 49, you are at a higher risk for certain long-term diseases and infections as well as injuries from falls. Falls are a major cause of broken bones and head injuries in people who are older than age 33. Getting regular preventive care can help to keep you healthy and well. Preventive care includes getting regular testing and making lifestyle changes as recommended by your health care provider. Talk with your health care provider about:  Which screenings and tests you should have. A screening is a test that checks for a disease when you have no symptoms.  A diet and exercise plan that is right for you. What should I know about screenings and tests to prevent falls? Screening and testing are the best ways to find a health problem early. Early diagnosis and treatment give you the best chance of managing medical conditions that are common after age 61. Certain conditions and lifestyle choices may make you more likely to have a fall. Your health care provider may recommend:  Regular vision checks. Poor vision and conditions such as cataracts can make you more likely to have a fall. If you wear glasses, make sure to get your prescription updated if your vision changes.  Medicine review. Work with your health care provider to regularly review all of the medicines you are taking, including over-the-counter medicines. Ask your health care provider about any side effects that may make you more likely to have a fall. Tell your health care provider if any medicines that you take make you feel dizzy or sleepy.  Osteoporosis screening. Osteoporosis is a condition that causes the bones to get weaker. This can  make the bones weak and cause them to break more easily.  Blood pressure screening. Blood pressure changes and medicines to control blood pressure can make you feel dizzy.  Strength and balance checks. Your health care provider may recommend certain tests to check your strength and balance while standing, walking, or changing positions.  Foot health exam. Foot pain and numbness, as well as not wearing proper footwear, can make you more likely to have a fall.  Depression screening. You may be more likely to have a fall if you have a fear of falling, feel emotionally low, or feel unable to do activities that you used to do.  Alcohol use screening. Using too much alcohol can affect your balance and may make you more likely to have a fall. What actions can I take to lower my risk of falls? General instructions  Talk with your health care provider about your risks for falling. Tell your health care provider if: ? You fall. Be sure to tell your health care provider about all falls, even ones that seem minor. ? You feel dizzy, sleepy, or off-balance.  Take over-the-counter and prescription medicines only as told by your health care provider. These include any supplements.  Eat a healthy diet and maintain a healthy weight. A healthy diet includes low-fat dairy products, low-fat (lean) meats, and fiber from whole grains, beans, and lots of fruits and vegetables. Home safety  Remove any tripping hazards, such as rugs, cords, and clutter.  Install safety equipment such as grab bars in bathrooms  and safety rails on stairs.  Keep rooms and walkways well-lit. Activity   Follow a regular exercise program to stay fit. This will help you maintain your balance. Ask your health care provider what types of exercise are appropriate for you.  If you need a cane or walker, use it as recommended by your health care provider.  Wear supportive shoes that have nonskid soles. Lifestyle  Do not drink  alcohol if your health care provider tells you not to drink.  If you drink alcohol, limit how much you have: ? 0-1 drink a day for women. ? 0-2 drinks a day for men.  Be aware of how much alcohol is in your drink. In the [place] one drink equals one typical bottle of beer (12 oz), one-half glass of wine (5 oz), or one shot of hard liquor (1 oz).  Do not use any products that contain nicotine or tobacco, such as cigarettes and e-cigarettes. If you need help quitting, ask your health care provider. Summary  Having a healthy lifestyle and getting preventive care can help to protect your health and wellness after age 45.  Screening and testing are the best way to find a health problem early and help you avoid having a fall. Early diagnosis and treatment give you the best chance for managing medical conditions that are more common for people who are older than age 39.  Falls are a major cause of broken bones and head injuries in people who are older than age 39. Take precautions to prevent a fall at home.  Work with your health care provider to learn what changes you can make to improve your health and wellness and to prevent falls. This information is not intended to replace advice given to you by your health care provider. Make sure you discuss any questions you have with your health care provider. Document Released: 05/09/2017 Document Revised: 05/09/2017 Document Reviewed: 05/09/2017 Elsevier Interactive Patient Education  2019 [employer].

## 2018-08-15 LAB — HEPATITIS C ANTIBODY
Hepatitis C Ab: NONREACTIVE
SIGNAL TO CUT-OFF: 0.01 (ref <1.00)

## 2018-08-16 ENCOUNTER — Encounter: Payer: Self-pay | Admitting: Family Medicine

## 2018-09-02 DIAGNOSIS — N951 Menopausal and female climacteric states: Secondary | ICD-10-CM | POA: Diagnosis not present

## 2018-09-02 DIAGNOSIS — E349 Endocrine disorder, unspecified: Secondary | ICD-10-CM | POA: Diagnosis not present

## 2018-09-02 DIAGNOSIS — R5383 Other fatigue: Secondary | ICD-10-CM | POA: Diagnosis not present

## 2018-09-02 DIAGNOSIS — E039 Hypothyroidism, unspecified: Secondary | ICD-10-CM | POA: Diagnosis not present

## 2018-09-03 DIAGNOSIS — L0109 Other impetigo: Secondary | ICD-10-CM | POA: Diagnosis not present

## 2018-09-03 DIAGNOSIS — L2389 Allergic contact dermatitis due to other agents: Secondary | ICD-10-CM | POA: Diagnosis not present

## 2018-09-17 DIAGNOSIS — L810 Postinflammatory hyperpigmentation: Secondary | ICD-10-CM | POA: Diagnosis not present

## 2018-11-13 DIAGNOSIS — B9789 Other viral agents as the cause of diseases classified elsewhere: Secondary | ICD-10-CM | POA: Diagnosis not present

## 2019-06-12 DIAGNOSIS — L80 Vitiligo: Secondary | ICD-10-CM | POA: Diagnosis not present

## 2019-06-12 DIAGNOSIS — L578 Other skin changes due to chronic exposure to nonionizing radiation: Secondary | ICD-10-CM | POA: Diagnosis not present

## 2019-06-12 DIAGNOSIS — L814 Other melanin hyperpigmentation: Secondary | ICD-10-CM | POA: Diagnosis not present

## 2019-06-12 DIAGNOSIS — L718 Other rosacea: Secondary | ICD-10-CM | POA: Diagnosis not present

## 2019-06-12 DIAGNOSIS — D1801 Hemangioma of skin and subcutaneous tissue: Secondary | ICD-10-CM | POA: Diagnosis not present

## 2019-08-01 ENCOUNTER — Other Ambulatory Visit: Payer: Self-pay | Admitting: Family Medicine

## 2019-08-01 DIAGNOSIS — Z1231 Encounter for screening mammogram for malignant neoplasm of breast: Secondary | ICD-10-CM

## 2019-09-10 ENCOUNTER — Other Ambulatory Visit: Payer: Self-pay

## 2019-09-10 ENCOUNTER — Ambulatory Visit
Admission: RE | Admit: 2019-09-10 | Discharge: 2019-09-10 | Disposition: A | Discharge status: Home / Self Care | Payer: PPO | Source: Ambulatory Visit | Attending: Family Medicine | Admitting: Family Medicine

## 2019-09-10 DIAGNOSIS — Z1231 Encounter for screening mammogram for malignant neoplasm of breast: Secondary | ICD-10-CM | POA: Diagnosis not present

## 2019-10-27 ENCOUNTER — Ambulatory Visit (INDEPENDENT_AMBULATORY_CARE_PROVIDER_SITE_OTHER): Payer: PPO | Admitting: Family Medicine

## 2019-10-27 ENCOUNTER — Encounter: Payer: Self-pay | Admitting: Family Medicine

## 2019-10-27 ENCOUNTER — Other Ambulatory Visit: Payer: Self-pay

## 2019-10-27 VITALS — BP 150/80 | HR 94 | Temp 98.4°F | Resp 16 | Ht 63.0 in | Wt 153.0 lb

## 2019-10-27 DIAGNOSIS — Z13220 Encounter for screening for lipoid disorders: Secondary | ICD-10-CM | POA: Diagnosis not present

## 2019-10-27 DIAGNOSIS — Z130 Encounter for screening for diseases of the blood and blood-forming organs and certain disorders involving the immune mechanism: Secondary | ICD-10-CM | POA: Diagnosis not present

## 2019-10-27 DIAGNOSIS — Z131 Encounter for screening for diabetes mellitus: Secondary | ICD-10-CM | POA: Diagnosis not present

## 2019-10-27 DIAGNOSIS — N644 Mastodynia: Secondary | ICD-10-CM | POA: Diagnosis not present

## 2019-10-27 LAB — CBC
HCT: 38.8 % (ref 36.0–46.0)
Hemoglobin: 13 g/dL (ref 12.0–15.0)
MCHC: 33.4 g/dL (ref 30.0–36.0)
MCV: 95.5 fl (ref 78.0–100.0)
Platelets: 297 10*3/uL (ref 150.0–400.0)
RBC: 4.06 Mil/uL (ref 3.87–5.11)
RDW: 13.1 % (ref 11.5–15.5)
WBC: 8.8 10*3/uL (ref 4.0–10.5)

## 2019-10-27 LAB — COMPREHENSIVE METABOLIC PANEL
ALT: 24 U/L (ref 0–35)
AST: 18 U/L (ref 0–37)
Albumin: 4.2 g/dL (ref 3.5–5.2)
Alkaline Phosphatase: 58 U/L (ref 39–117)
BUN: 15 mg/dL (ref 6–23)
CO2: 25 mEq/L (ref 19–32)
Calcium: 9 mg/dL (ref 8.4–10.5)
Chloride: 101 mEq/L (ref 96–112)
Creatinine, Ser: 0.59 mg/dL (ref 0.40–1.20)
GFR: 100.22 mL/min (ref >60.00)
Glucose, Bld: 85 mg/dL (ref 70–99)
Potassium: 4.5 mEq/L (ref 3.5–5.1)
Sodium: 134 mEq/L — ABNORMAL LOW (ref 135–145)
Total Bilirubin: 0.2 mg/dL (ref 0.2–1.2)
Total Protein: 6.4 g/dL (ref 6.0–8.3)

## 2019-10-27 LAB — LIPID PANEL
Cholesterol: 191 mg/dL (ref 0–200)
HDL: 75.4 mg/dL (ref >39.00)
NonHDL: 116.09
Total CHOL/HDL Ratio: 3
Triglycerides: 217 mg/dL — ABNORMAL HIGH (ref 0.0–149.0)
VLDL: 43.4 mg/dL — ABNORMAL HIGH (ref 0.0–40.0)

## 2019-10-27 LAB — LDL CHOLESTEROL, DIRECT: Direct LDL: 81 mg/dL

## 2019-10-27 LAB — HEMOGLOBIN A1C: Hgb A1c MFr Bld: 5.4 % (ref 4.6–6.5)

## 2019-10-27 NOTE — Progress Notes (Addendum)
[facility] at [facility] [address], [place], [place] [address] [phone]  Date:  10/27/2019   Name:  Maureen Obrien   DOB:  June 02, 1946   MRN:  [id number]  PCP:  Darreld Mclean, MD    Chief Complaint: Nipple Pain (nipple pain, started last week, left side, no discharge)   History of Present Illness:  Maureen Obrien is a 74 y.o. very pleasant female patient who presents with the following:  Patient with history of hypothyroidism, here today with a breast concern Last seen by myself just over a year ago for physical exam  She is generally good health, has 2 children who live in [place] and 4 grandchildren  She has an endocrinologist in [place] who manages her thyroid and HRT Most recent labs 1 year ago, can update today if she would like Most recent mammogram 6 weeks ago-negative She had a bx once but never had any breast cancer- however she has 2 sisters with history of breast cancer  She notes history of inverted nipples bilaterally- this is undchanged The left nipple became tender about 1 week ago No discharge No injury that she can recall   She has been under some stress but no physical injury that she can recall They are moving her 63 MIL to a patio home close to her and her husband in [place]   No fever or cough, no CP or SOB- she otherwise feels well  No skin change visible   COVID-19 vaccine- not done yet, she is not really interested in doing this.  We discussed and I encouraged her to get this vaccine  BP Readings from Last 3 Encounters:  10/27/19 (!) 150/80  08/14/18 124/80  10/03/17 (!) 142/86    Patient Active Problem List   Diagnosis Date Noted  . Hypothyroidism (acquired) 08/11/2018    Past Medical History:  Diagnosis Date  . Asthma   . Chicken pox   . Colon polyps   . Hypothyroidism     Past Surgical History:  Procedure Laterality Date  . BREAST BIOPSY  1988  . PARTIAL  HYSTERECTOMY  1993  . TONSILLECTOMY     childhood    Social History   Tobacco Use  . Smoking status: Never Smoker  . Smokeless tobacco: Never Used  Substance Use Topics  . Alcohol use: Yes  . Drug use: Not on file    Family History  Problem Relation Age of Onset  . Pancreatic cancer Mother   . Aneurysm Father   . Breast cancer Sister   . Skin cancer Brother   . Heart attack Brother   . Breast cancer Sister   . Hepatitis C Brother   . Skin cancer Brother     Allergies  Allergen Reactions  . Eggs Or Egg-Derived Products Other (See Comments)  . Penicillin G Other (See Comments)    States since allergic to eggs can not use this    Medication list has been reviewed and updated.  Current Outpatient Medications on File Prior to Visit  Medication Sig Dispense Refill  . doxycycline (VIBRAMYCIN) 100 MG capsule TK 1 C PO BID WITH A FULL GLASS OF WATER    . estradiol (ESTRACE) 2 MG tablet Take 2 mg by mouth.    . metroNIDAZOLE (METROCREAM) 0.75 % cream Use as directed twice daily    . mupirocin ointment (BACTROBAN) 2 % APP EXT AA BID    . progesterone (  PROMETRIUM) 200 MG capsule Take 1 capsule by mouth daily.    Marland Kitchen thyroid (ARMOUR THYROID) 120 MG tablet TAKE 1 TABLET BY MOUTH DAILY IN THE MORNING     No current facility-administered medications on file prior to visit.    Review of Systems:  As per HPI- otherwise negative.   Physical Examination: Vitals:   10/27/19 1331 10/27/19 1350  BP: (!) 166/104 (!) 150/80  Pulse: 94   Resp: 16   Temp: 98.4 F (36.9 C)   SpO2: 96%    Vitals:   10/27/19 1331  Weight: 153 lb (69.4 kg)  Height: 5\' 3"  (1.6 m)   Body mass index is 27.1 kg/m. Ideal Body Weight: Weight in (lb) to have BMI = 25: 140.8  GEN: no acute distress.  Mild overweight, looks well HEENT: Atraumatic, Normocephalic.  Ears and Nose: No external deformity. CV: RRR, No M/G/R. No JVD. No thrill. No extra heart sounds. PULM: CTA B, no wheezes, crackles,  rhonchi. No retractions. No resp. distress. No accessory muscle use. ABD: S, NT, ND. No rebound. No HSM. EXTR: No c/c/e PSYCH: Normally interactive. Conversant.  Normal breast and axillary exam except for stable inverted nipples bilaterally    Assessment and Plan: Nipple pain - Plan: MM DIAG BREAST TOMO UNI LEFT, US BREAST COMPLETE UNI LEFT INC AXILLA  Screening for diabetes mellitus - Plan: Comprehensive metabolic panel, Hemoglobin A1c  Screening for hyperlipidemia - Plan: Lipid panel  Screening for deficiency anemia - Plan: CBC  BP elevated today- may be due to anxiety. Will monitor- remind pt with labs Routine labs pending as above Referral for diag mammo and [place]  Moderate med decision making today  This visit occurred during the SARS-CoV-2 public health emergency.  Safety protocols were in place, including screening questions prior to the visit, additional usage of staff PPE, and extensive cleaning of exam room while observing appropriate contact time as indicated for disinfecting solutions.     Signed Lamar Blinks, MD  Received her labs as follows, message to patient  Results for orders placed or performed in visit on 10/27/19  CBC  Result Value Ref Range   WBC 8.8 4.0 - 10.5 K/uL   RBC 4.06 3.87 - 5.11 Mil/uL   Platelets 297.0 150.0 - 400.0 K/uL   Hemoglobin 13.0 12.0 - 15.0 g/dL   HCT 38.8 36.0 - 46.0 %   MCV 95.5 78.0 - 100.0 fl   MCHC 33.4 30.0 - 36.0 g/dL   RDW 13.1 11.5 - 15.5 %  Comprehensive metabolic panel  Result Value Ref Range   Sodium 134 (L) 135 - 145 mEq/L   Potassium 4.5 3.5 - 5.1 mEq/L   Chloride 101 96 - 112 mEq/L   CO2 25 19 - 32 mEq/L   Glucose, Bld 85 70 - 99 mg/dL   BUN 15 6 - 23 mg/dL   Creatinine, Ser 0.59 0.40 - 1.20 mg/dL   Total Bilirubin 0.2 0.2 - 1.2 mg/dL   Alkaline Phosphatase 58 39 - 117 U/L   AST 18 0 - 37 U/L   ALT 24 0 - 35 U/L   Total Protein 6.4 6.0 - 8.3 g/dL   Albumin 4.2 3.5 - 5.2 g/dL   GFR 100.22 >60.00 mL/min    Calcium 9.0 8.4 - 10.5 mg/dL  Hemoglobin A1c  Result Value Ref Range   Hgb A1c MFr Bld 5.4 4.6 - 6.5 %  Lipid panel  Result Value Ref Range   Cholesterol 191 0 - 200 mg/dL  Triglycerides 217.0 (H) 0.0 - 149.0 mg/dL   HDL 75.40 >39.00 mg/dL   VLDL 43.4 (H) 0.0 - 40.0 mg/dL   Total CHOL/HDL Ratio 3    NonHDL 116.09   LDL cholesterol, direct  Result Value Ref Range   Direct LDL 81.0 mg/dL

## 2019-10-27 NOTE — Patient Instructions (Addendum)
It was good to see you again today- I will be in touch with your labs We will arrange for a "diagnostic" mammogram and ultrasound of the left breast only at the [facility] [address] Let me know if you don't hear about this appt soon

## 2019-10-28 ENCOUNTER — Encounter: Payer: Self-pay | Admitting: Family Medicine

## 2019-10-29 ENCOUNTER — Ambulatory Visit: Payer: PPO | Admitting: Family Medicine

## 2019-11-06 ENCOUNTER — Ambulatory Visit
Admission: RE | Admit: 2019-11-06 | Discharge: 2019-11-06 | Disposition: A | Discharge status: Home / Self Care | Payer: PPO | Source: Ambulatory Visit | Attending: Family Medicine | Admitting: Family Medicine

## 2019-11-06 ENCOUNTER — Encounter: Payer: Self-pay | Admitting: Family Medicine

## 2019-11-06 ENCOUNTER — Other Ambulatory Visit: Payer: Self-pay

## 2019-11-06 DIAGNOSIS — N644 Mastodynia: Secondary | ICD-10-CM

## 2019-11-06 DIAGNOSIS — N6489 Other specified disorders of breast: Secondary | ICD-10-CM | POA: Diagnosis not present

## 2019-11-06 DIAGNOSIS — R922 Inconclusive mammogram: Secondary | ICD-10-CM | POA: Diagnosis not present

## 2019-11-08 DIAGNOSIS — N6120 Granulomatous mastitis, unspecified breast: Secondary | ICD-10-CM

## 2019-11-08 HISTORY — DX: Granulomatous mastitis, unspecified breast: N61.20

## 2019-11-10 ENCOUNTER — Encounter: Payer: Self-pay | Admitting: Family Medicine

## 2019-11-14 ENCOUNTER — Encounter: Payer: Self-pay | Admitting: Family Medicine

## 2019-11-17 DIAGNOSIS — N61 Mastitis without abscess: Secondary | ICD-10-CM | POA: Diagnosis not present

## 2019-11-19 ENCOUNTER — Encounter: Payer: Self-pay | Admitting: Family Medicine

## 2019-11-19 DIAGNOSIS — N641 Fat necrosis of breast: Secondary | ICD-10-CM | POA: Diagnosis not present

## 2019-11-19 DIAGNOSIS — N61 Mastitis without abscess: Secondary | ICD-10-CM | POA: Diagnosis not present

## 2019-11-19 DIAGNOSIS — N63 Unspecified lump in unspecified breast: Secondary | ICD-10-CM | POA: Diagnosis not present

## 2019-11-25 ENCOUNTER — Encounter: Payer: Self-pay | Admitting: Family Medicine

## 2019-11-25 DIAGNOSIS — N6453 Retraction of nipple: Secondary | ICD-10-CM

## 2019-11-25 NOTE — Telephone Encounter (Signed)
Caller: [facility] Phone number [phone]  Dr. Vertis Kelch would like Dr. Lorelei Pont to call him back in regards to patient suspicious of breast cancer. bail

## 2019-11-25 NOTE — Telephone Encounter (Signed)
Called and spoke with Dr Annell Greening- he did a core bx in his office in [place].   Results are not diagnostic (just showed necrotic tissue) but he suspects inflammatory breast cancer and thinks she needs a repeat biopsy ASAP  I will touch base with the patient and plan to have her seen at the [facility] surgeon breast center ASAP  [phone]  I called and discussed with our Breast radiologist- they recommend that we do MRI and have her seen by breast surgery both  diag mammo 11/06/19: IMPRESSION: There is a subtle ill-defined asymmetry in the left retroareolar region on the cc view only. Given the subtle nature of this finding, MRI is recommended to evaluate the new inversion. If MRI does not demonstrate a discrete biopsy target, recommend stereotactic biopsy of the left breast asymmetry.  RECOMMENDATION: Recommend breast MRI to evaluate the patient's new nipple inversion. If the MRI does not demonstrate a discrete biopsy target, recommend stereotactic biopsy of the left breast asymmetry behind the nipple seen on the cc view.  I have discussed the findings and recommendations with the patient. If applicable, a reminder letter will be sent to the patient regarding the next appointment.  BI-RADS CATEGORY  4: Suspicious.

## 2019-11-26 DIAGNOSIS — N6452 Nipple discharge: Secondary | ICD-10-CM | POA: Diagnosis not present

## 2019-11-26 DIAGNOSIS — N61 Mastitis without abscess: Secondary | ICD-10-CM | POA: Diagnosis not present

## 2019-11-26 DIAGNOSIS — Z9889 Other specified postprocedural states: Secondary | ICD-10-CM | POA: Diagnosis not present

## 2019-11-26 DIAGNOSIS — Z20822 Contact with and (suspected) exposure to covid-19: Secondary | ICD-10-CM | POA: Diagnosis not present

## 2019-11-27 ENCOUNTER — Encounter: Payer: Self-pay | Admitting: Family Medicine

## 2019-11-27 NOTE — Telephone Encounter (Signed)
Sent as FYI. 

## 2019-11-29 ENCOUNTER — Encounter (HOSPITAL_COMMUNITY): Payer: Self-pay | Admitting: Emergency Medicine

## 2019-11-29 ENCOUNTER — Emergency Department (HOSPITAL_COMMUNITY)
Admission: EM | Admit: 2019-11-29 | Discharge: 2019-11-29 | Disposition: A | Discharge status: Home / Self Care | Payer: PPO | Attending: Emergency Medicine | Admitting: Emergency Medicine

## 2019-11-29 ENCOUNTER — Other Ambulatory Visit: Payer: Self-pay

## 2019-11-29 DIAGNOSIS — Z79899 Other long term (current) drug therapy: Secondary | ICD-10-CM | POA: Diagnosis not present

## 2019-11-29 DIAGNOSIS — J45909 Unspecified asthma, uncomplicated: Secondary | ICD-10-CM | POA: Diagnosis not present

## 2019-11-29 DIAGNOSIS — N61 Mastitis without abscess: Secondary | ICD-10-CM | POA: Insufficient documentation

## 2019-11-29 DIAGNOSIS — E039 Hypothyroidism, unspecified: Secondary | ICD-10-CM | POA: Diagnosis not present

## 2019-11-29 DIAGNOSIS — Z4801 Encounter for change or removal of surgical wound dressing: Secondary | ICD-10-CM | POA: Diagnosis present

## 2019-11-29 LAB — COMPREHENSIVE METABOLIC PANEL
ALT: 42 U/L (ref 0–44)
AST: 26 U/L (ref 15–41)
Albumin: 3.5 g/dL (ref 3.5–5.0)
Alkaline Phosphatase: 75 U/L (ref 38–126)
Anion gap: 11 (ref 5–15)
BUN: 9 mg/dL (ref 8–23)
CO2: 22 mmol/L (ref 22–32)
Calcium: 9 mg/dL (ref 8.9–10.3)
Chloride: 105 mmol/L (ref 98–111)
Creatinine, Ser: 0.7 mg/dL (ref 0.44–1.00)
GFR calc Af Amer: >60 mL/min (ref >60)
GFR calc non Af Amer: >60 mL/min (ref >60)
Glucose, Bld: 134 mg/dL — ABNORMAL HIGH (ref 70–99)
Potassium: 4.8 mmol/L (ref 3.5–5.1)
Sodium: 138 mmol/L (ref 135–145)
Total Bilirubin: 0.4 mg/dL (ref 0.3–1.2)
Total Protein: 6.3 g/dL — ABNORMAL LOW (ref 6.5–8.1)

## 2019-11-29 LAB — CBC WITH DIFFERENTIAL/PLATELET
Abs Immature Granulocytes: 0.03 10*3/uL (ref 0.00–0.07)
Basophils Absolute: 0.1 10*3/uL (ref 0.0–0.1)
Basophils Relative: 1 %
Eosinophils Absolute: 0.2 10*3/uL (ref 0.0–0.5)
Eosinophils Relative: 4 %
HCT: 39.3 % (ref 36.0–46.0)
Hemoglobin: 12.6 g/dL (ref 12.0–15.0)
Immature Granulocytes: 1 %
Lymphocytes Relative: 15 %
Lymphs Abs: 0.9 10*3/uL (ref 0.7–4.0)
MCH: 30.2 pg (ref 26.0–34.0)
MCHC: 32.1 g/dL (ref 30.0–36.0)
MCV: 94.2 fL (ref 80.0–100.0)
Monocytes Absolute: 0.9 10*3/uL (ref 0.1–1.0)
Monocytes Relative: 14 %
Neutro Abs: 3.9 10*3/uL (ref 1.7–7.7)
Neutrophils Relative %: 65 %
Platelets: 360 10*3/uL (ref 150–400)
RBC: 4.17 MIL/uL (ref 3.87–5.11)
RDW: 11.6 % (ref 11.5–15.5)
WBC: 5.9 10*3/uL (ref 4.0–10.5)
nRBC: 0 % (ref 0.0–0.2)

## 2019-11-29 LAB — LACTIC ACID, PLASMA: Lactic Acid, Venous: 1.6 mmol/L (ref 0.5–1.9)

## 2019-11-29 MED ORDER — ORITAVANCIN DIPHOSPHATE 400 MG IV SOLR
1200.0000 mg | Freq: Once | INTRAVENOUS | Status: AC
  Administered 2019-11-29: 1200 mg via INTRAVENOUS
  Filled 2019-11-29: qty 120

## 2019-11-29 MED ORDER — DEXTROSE 5 % IV SOLN
1500.0000 mg | Freq: Once | INTRAVENOUS | Status: DC
  Filled 2019-11-29: qty 75

## 2019-11-29 MED ORDER — SODIUM CHLORIDE 0.9 % IV BOLUS
1000.0000 mL | Freq: Once | INTRAVENOUS | Status: AC
  Administered 2019-11-29: 1000 mL via INTRAVENOUS

## 2019-11-29 NOTE — ED Triage Notes (Addendum)
Pt states she had a breast biopsy on L breast on 5/12.  C/o redness and drainage at surgical site.  States she went to [facility] ED for same a few days ago and left because she couldn't wait 4 hours for IV medication.  Pt requesting we review records from [facility].

## 2019-11-29 NOTE — ED Provider Notes (Signed)
Patient finished up antibiotic infusion.  Tolerated it very well.  Will follow-up outpatient.   Lennice Sites, DO 11/29/19 1945

## 2019-11-29 NOTE — ED Notes (Signed)
Discharge instructions discussed with pt. Pt verbalized understanding. Pt stable and ambulatory. No signature pad available. 

## 2019-11-29 NOTE — ED Provider Notes (Addendum)
[facility] EMERGENCY DEPARTMENT Provider Note   CSN: [id number] Arrival date & time: 11/29/19  1258     History Chief Complaint  Patient presents with  . Wound Check    Maureen Obrien is a 74 y.o. female.  HPI 74 year old female presents with left breast infection.  On 5/10 she had a breast biopsy.  Noticed an inverted nipple while she was in [place] and her brother-in-law who is a Psychologist, sport and exercise was able to get her a biopsy.  The path report came back okay.  For the last couple weeks she has been having redness to the site.  Over the last 5 days or so it has been having drainage that is purulent.  Went to [facility] ER on 5/19.  Had ultrasound and labs and saw surgeon.  Was prescribed Bactrim which she has been taking.  She feeling the redness is a little better.  However she is in the hospital today because her mother-in-law passed away this afternoon.  Since she is here she was hoping to get an IV dose of antibiotics as she did not have time to get that last time.  She denies fevers though the site is warm.   Past Medical History:  Diagnosis Date  . Asthma   . Chicken pox   . Colon polyps   . Hypothyroidism     Patient Active Problem List   Diagnosis Date Noted  . Hypothyroidism (acquired) 08/11/2018    Past Surgical History:  Procedure Laterality Date  . BREAST CYST ASPIRATION Left 35 yrs ago  . PARTIAL HYSTERECTOMY  1993  . TONSILLECTOMY     childhood     OB History   No obstetric history on file.     Family History  Problem Relation Age of Onset  . Pancreatic cancer Mother   . Aneurysm Father   . Breast cancer Sister 34  . Skin cancer Brother   . Heart attack Brother   . Breast cancer Sister 13  . Hepatitis C Brother   . Skin cancer Brother     Social History   Tobacco Use  . Smoking status: Never Smoker  . Smokeless tobacco: Never Used  Substance Use Topics  . Alcohol use: Yes  . Drug use: Not on file    Home  Medications Prior to Admission medications   Medication Sig Start Date End Date Taking? Authorizing Provider  doxycycline (VIBRAMYCIN) 100 MG capsule TK 1 C PO BID WITH A FULL GLASS OF WATER 07/17/18   [provider]  estradiol (ESTRACE) 2 MG tablet Take 2 mg by mouth. 12/22/15   [provider]  metroNIDAZOLE (METROCREAM) 0.75 % cream Use as directed twice daily 02/05/17   [provider]  mupirocin ointment (BACTROBAN) 2 % APP EXT AA BID 07/17/18   [provider]  progesterone (PROMETRIUM) 200 MG capsule Take 1 capsule by mouth daily. 12/05/16   [provider]  thyroid (ARMOUR THYROID) 120 MG tablet TAKE 1 TABLET BY MOUTH DAILY IN THE MORNING 12/05/16   [provider]    Allergies    Eggs or egg-derived products and Penicillin g  Review of Systems   Review of Systems  Constitutional: Negative for fever.  Skin: Positive for color change and wound.  All other systems reviewed and are negative.   Physical Exam Updated Vital Signs BP (!) 143/77   Pulse 88   Temp 97.9 F (36.6 C) (Oral)   Resp 16   SpO2 98%  Physical Exam Vitals and nursing note reviewed. Exam conducted with a chaperone present.  Constitutional:      Appearance: She is well-developed.  HENT:     Head: Normocephalic and atraumatic.     Right Ear: External ear normal.     Left Ear: External ear normal.     Nose: Nose normal.  Eyes:     General:        Right eye: No discharge.        Left eye: No discharge.  Cardiovascular:     Rate and Rhythm: Normal rate and regular rhythm.     Heart sounds: Normal heart sounds.  Pulmonary:     Effort: Pulmonary effort is normal.     Breath sounds: Normal breath sounds.  Chest:    Abdominal:     Palpations: Abdomen is soft.     Tenderness: There is no abdominal tenderness.  Skin:    General: Skin is warm and dry.  Neurological:     Mental Status: She is alert.  Psychiatric:        Mood and Affect: Mood is not  anxious.     ED Results / Procedures / Treatments   Labs (all labs ordered are listed, but only abnormal results are displayed) Labs Reviewed  COMPREHENSIVE METABOLIC PANEL - Abnormal; Notable for the following components:      Result Value   Glucose, Bld 134 (*)    Total Protein 6.3 (*)    All other components within normal limits  CULTURE, BLOOD (ROUTINE X 2)  CULTURE, BLOOD (ROUTINE X 2)  AEROBIC CULTURE (SUPERFICIAL SPECIMEN)  LACTIC ACID, PLASMA  CBC WITH DIFFERENTIAL/PLATELET  LACTIC ACID, PLASMA    EKG None  Radiology No results found.  Procedures Procedures (including critical care time)  Medications Ordered in ED Medications  sodium chloride 0.9 % bolus 1,000 mL (1,000 mLs Intravenous New Bag/Given 11/29/19 1416)    ED Course  I have reviewed the triage vital signs and the nursing notes.  Pertinent labs & imaging results that were available during my care of the patient were reviewed by me and considered in my medical decision making (see chart for details).    MDM Rules/Calculators/A&P                      Patient's labs are reassuring.  Normal WBC and lactate.  While she clinically does have an infection, it also seems to be getting better based on picture she showed me in her report from 3 days ago.  Pharmacy discussed using Dalvance.  This should cover strep and staph.  She will be instructed to stop her Bactrim.  [place] at [facility] reportedly showed no drainable abscess a couple days ago. We discussed return precautions. Final Clinical Impression(s) / ED Diagnoses Final diagnoses:  Cellulitis of left breast    Rx / DC Orders ED Discharge Orders    None       Sherwood Gambler, MD 11/29/19 [id number]    Sherwood Gambler, MD 11/29/19 1510

## 2019-11-29 NOTE — Discharge Instructions (Addendum)
You were given a strong IV antibiotic called Dalvance.  You do not need to continue your antibiotic Bactrim anymore.  This antibiotic is effective for a single dose.    If your redness/wound is not improving over the next 48 hours or if it seems to get worse or if you develop fever, vomiting, or any other new/concerning symptoms then return to the ER for evaluation.

## 2019-12-02 LAB — AEROBIC CULTURE W GRAM STAIN (SUPERFICIAL SPECIMEN): Culture: NO GROWTH

## 2019-12-02 NOTE — Progress Notes (Signed)
[facility] at [facility] [address], [place], [place] [address] [phone]  Date:  12/03/2019   Name:  Maureen Obrien   DOB:  1945-09-19   MRN:  [id number]  PCP:  Darreld Mclean, MD    Chief Complaint: No chief complaint on file.   History of Present Illness:  Maureen Obrien is a 74 y.o. very pleasant female patient who presents with the following: Virtual visit today due to providers child being ill Providers at home, patient is also at home.  Patient identity confirmed with 2 factors, she gives consent for virtual visit today.  The patient and myself are present on the visit today  Pt with history of hypothyroidism She recently had a breast bx performed out of town in [place] on 5/12 The result was negative but there is still concern of possible occult inflammatory breast cancer given appearance of breast She developed and infection at the site- went to the ER at [facility] on 5/19 and was given bactrim Followed up at [facility] ER on 5/22 to follow-up on this infection- she got a dose of IV abx and this does seem to be helping quite a bit She was told to stop the oral abx-Septra.  She wonders if she could go back on this, she is concerned about residual infection Her breast is still warm, but much better than it was  No fever  She has an appt with breast surgeon at [facility] the day after tomorrow.  She expects to have an MRI, and a repeat biopsy  She could use some pain medication- she does not have anything at home except naproxen She is planning the memorial services for her MIL who died just recently.  She is under a lot of stress-otherwise she does not feel physically ill  Patient Active Problem List   Diagnosis Date Noted  . Hypothyroidism (acquired) 08/11/2018    Past Medical History:  Diagnosis Date  . Asthma   . Chicken pox   . Colon polyps   . Hypothyroidism     Past Surgical History:  Procedure Laterality  Date  . BREAST CYST ASPIRATION Left 35 yrs ago  . PARTIAL HYSTERECTOMY  1993  . TONSILLECTOMY     childhood    Social History   Tobacco Use  . Smoking status: Never Smoker  . Smokeless tobacco: Never Used  Substance Use Topics  . Alcohol use: Yes  . Drug use: Not on file    Family History  Problem Relation Age of Onset  . Pancreatic cancer Mother   . Aneurysm Father   . Breast cancer Sister 42  . Skin cancer Brother   . Heart attack Brother   . Breast cancer Sister 61  . Hepatitis C Brother   . Skin cancer Brother     Allergies  Allergen Reactions  . Eggs Or Egg-Derived Products Other (See Comments)  . Penicillin G Other (See Comments)    States since allergic to eggs can not use this    Medication list has been reviewed and updated.  Current Outpatient Medications on File Prior to Visit  Medication Sig Dispense Refill  . doxycycline (VIBRAMYCIN) 100 MG capsule TK 1 C PO BID WITH A FULL GLASS OF WATER    . estradiol (ESTRACE) 2 MG tablet Take 2 mg by mouth.    . metroNIDAZOLE (METROCREAM) 0.75 % cream Use as directed twice daily    . mupirocin ointment (  BACTROBAN) 2 % APP EXT AA BID    . progesterone (PROMETRIUM) 200 MG capsule Take 1 capsule by mouth daily.    Marland Kitchen thyroid (ARMOUR THYROID) 120 MG tablet TAKE 1 TABLET BY MOUTH DAILY IN THE MORNING     No current facility-administered medications on file prior to visit.    Review of Systems:  As per HPI- otherwise negative.   Physical Examination: There were no vitals filed for this visit. There were no vitals filed for this visit. There is no height or weight on file to calculate BMI. Ideal Body Weight:    Patient observed over video monitor.  She looks well, her normal self.  She did show me her left breast, you can see a healing biopsy site and lateral area of redness and inflammation.  Per patient, this is actually much improved over several days ago.  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  She had a breast biopsy which became infected and was quite painful.  Her infection is now under much better control.  She would like to continue taking Septra by mouth which I think is reasonable.  She would also like some pain control which is appropriate as she is facing another biopsy in the next week or so.  Her controlled substance database is reviewed  Called in hydrocodone, cautioned her to use this sparingly and do not drive after taking medication She will let us know what else we can do to be of service  Video used for duration of visit Moderate medical decision making today  This visit occurred during the SARS-CoV-2 public health emergency.  Safety protocols were in place, including screening questions prior to the visit, additional usage of staff PPE, and extensive cleaning of exam room while observing appropriate contact time as indicated for disinfecting solutions.    Signed Lamar Blinks, MD

## 2019-12-03 ENCOUNTER — Encounter: Payer: Self-pay | Admitting: Family Medicine

## 2019-12-03 ENCOUNTER — Telehealth (INDEPENDENT_AMBULATORY_CARE_PROVIDER_SITE_OTHER): Payer: PPO | Admitting: Family Medicine

## 2019-12-03 ENCOUNTER — Other Ambulatory Visit: Payer: Self-pay

## 2019-12-03 DIAGNOSIS — N644 Mastodynia: Secondary | ICD-10-CM | POA: Diagnosis not present

## 2019-12-03 DIAGNOSIS — N6459 Other signs and symptoms in breast: Secondary | ICD-10-CM | POA: Diagnosis not present

## 2019-12-03 DIAGNOSIS — N6489 Other specified disorders of breast: Secondary | ICD-10-CM | POA: Diagnosis not present

## 2019-12-03 MED ORDER — HYDROCODONE-ACETAMINOPHEN 5-325 MG PO TABS: 1.0000 | ORAL_TABLET | Freq: Three times a day (TID) | ORAL | 0 refills | Status: AC | PRN

## 2019-12-04 LAB — CULTURE, BLOOD (ROUTINE X 2)
Culture: NO GROWTH
Culture: NO GROWTH
Special Requests: ADEQUATE
Special Requests: ADEQUATE

## 2019-12-05 DIAGNOSIS — N6122 Granulomatous mastitis, left breast: Secondary | ICD-10-CM | POA: Diagnosis not present

## 2019-12-10 DIAGNOSIS — N6122 Granulomatous mastitis, left breast: Secondary | ICD-10-CM | POA: Diagnosis not present

## 2019-12-10 DIAGNOSIS — R928 Other abnormal and inconclusive findings on diagnostic imaging of breast: Secondary | ICD-10-CM | POA: Diagnosis not present

## 2019-12-30 DIAGNOSIS — N61 Mastitis without abscess: Secondary | ICD-10-CM | POA: Diagnosis not present

## 2019-12-30 DIAGNOSIS — N644 Mastodynia: Secondary | ICD-10-CM | POA: Diagnosis not present

## 2019-12-30 DIAGNOSIS — N6320 Unspecified lump in the left breast, unspecified quadrant: Secondary | ICD-10-CM | POA: Diagnosis not present

## 2019-12-30 DIAGNOSIS — R9389 Abnormal findings on diagnostic imaging of other specified body structures: Secondary | ICD-10-CM | POA: Diagnosis not present

## 2019-12-30 DIAGNOSIS — N6122 Granulomatous mastitis, left breast: Secondary | ICD-10-CM | POA: Diagnosis not present

## 2019-12-30 DIAGNOSIS — R928 Other abnormal and inconclusive findings on diagnostic imaging of breast: Secondary | ICD-10-CM | POA: Diagnosis not present

## 2020-01-28 DIAGNOSIS — N6122 Granulomatous mastitis, left breast: Secondary | ICD-10-CM | POA: Diagnosis not present

## 2020-01-28 DIAGNOSIS — R928 Other abnormal and inconclusive findings on diagnostic imaging of breast: Secondary | ICD-10-CM | POA: Diagnosis not present

## 2020-02-02 DIAGNOSIS — E039 Hypothyroidism, unspecified: Secondary | ICD-10-CM | POA: Diagnosis not present

## 2020-02-02 DIAGNOSIS — R5383 Other fatigue: Secondary | ICD-10-CM | POA: Diagnosis not present

## 2020-02-02 DIAGNOSIS — N951 Menopausal and female climacteric states: Secondary | ICD-10-CM | POA: Diagnosis not present

## 2020-02-02 DIAGNOSIS — E349 Endocrine disorder, unspecified: Secondary | ICD-10-CM | POA: Diagnosis not present

## 2020-03-30 ENCOUNTER — Telehealth: Payer: Self-pay | Admitting: Family Medicine

## 2020-03-30 NOTE — Telephone Encounter (Signed)
Pt never picked up results in may. Mailed out [employer].

## 2020-03-31 DIAGNOSIS — N6122 Granulomatous mastitis, left breast: Secondary | ICD-10-CM | POA: Diagnosis not present

## 2020-04-27 DIAGNOSIS — N6122 Granulomatous mastitis, left breast: Secondary | ICD-10-CM | POA: Diagnosis not present

## 2020-04-27 DIAGNOSIS — N6489 Other specified disorders of breast: Secondary | ICD-10-CM | POA: Diagnosis not present

## 2020-04-27 DIAGNOSIS — N6320 Unspecified lump in the left breast, unspecified quadrant: Secondary | ICD-10-CM | POA: Diagnosis not present

## 2020-07-23 DIAGNOSIS — H25813 Combined forms of age-related cataract, bilateral: Secondary | ICD-10-CM | POA: Diagnosis not present

## 2020-08-03 DIAGNOSIS — N6122 Granulomatous mastitis, left breast: Secondary | ICD-10-CM | POA: Diagnosis not present

## 2020-08-05 DIAGNOSIS — L578 Other skin changes due to chronic exposure to nonionizing radiation: Secondary | ICD-10-CM | POA: Diagnosis not present

## 2020-08-05 DIAGNOSIS — D1801 Hemangioma of skin and subcutaneous tissue: Secondary | ICD-10-CM | POA: Diagnosis not present

## 2020-08-05 DIAGNOSIS — D692 Other nonthrombocytopenic purpura: Secondary | ICD-10-CM | POA: Diagnosis not present

## 2020-08-05 DIAGNOSIS — L821 Other seborrheic keratosis: Secondary | ICD-10-CM | POA: Diagnosis not present

## 2020-08-05 DIAGNOSIS — X32XXXS Exposure to sunlight, sequela: Secondary | ICD-10-CM | POA: Diagnosis not present

## 2020-08-05 DIAGNOSIS — L814 Other melanin hyperpigmentation: Secondary | ICD-10-CM | POA: Diagnosis not present

## 2020-08-05 DIAGNOSIS — L718 Other rosacea: Secondary | ICD-10-CM | POA: Diagnosis not present

## 2020-08-14 IMAGING — MG MM DIGITAL DIAGNOSTIC UNILAT*L* W/ TOMO W/ CAD
4 series · 4 of 12 positions shown · non-contrast
Comparison: Previous exam(s).

CLINICAL DATA: New left inversion.

EXAM:
DIGITAL DIAGNOSTIC LEFT MAMMOGRAM WITH CAD AND TOMO
ULTRASOUND LEFT BREAST

[L CC synth-2D]
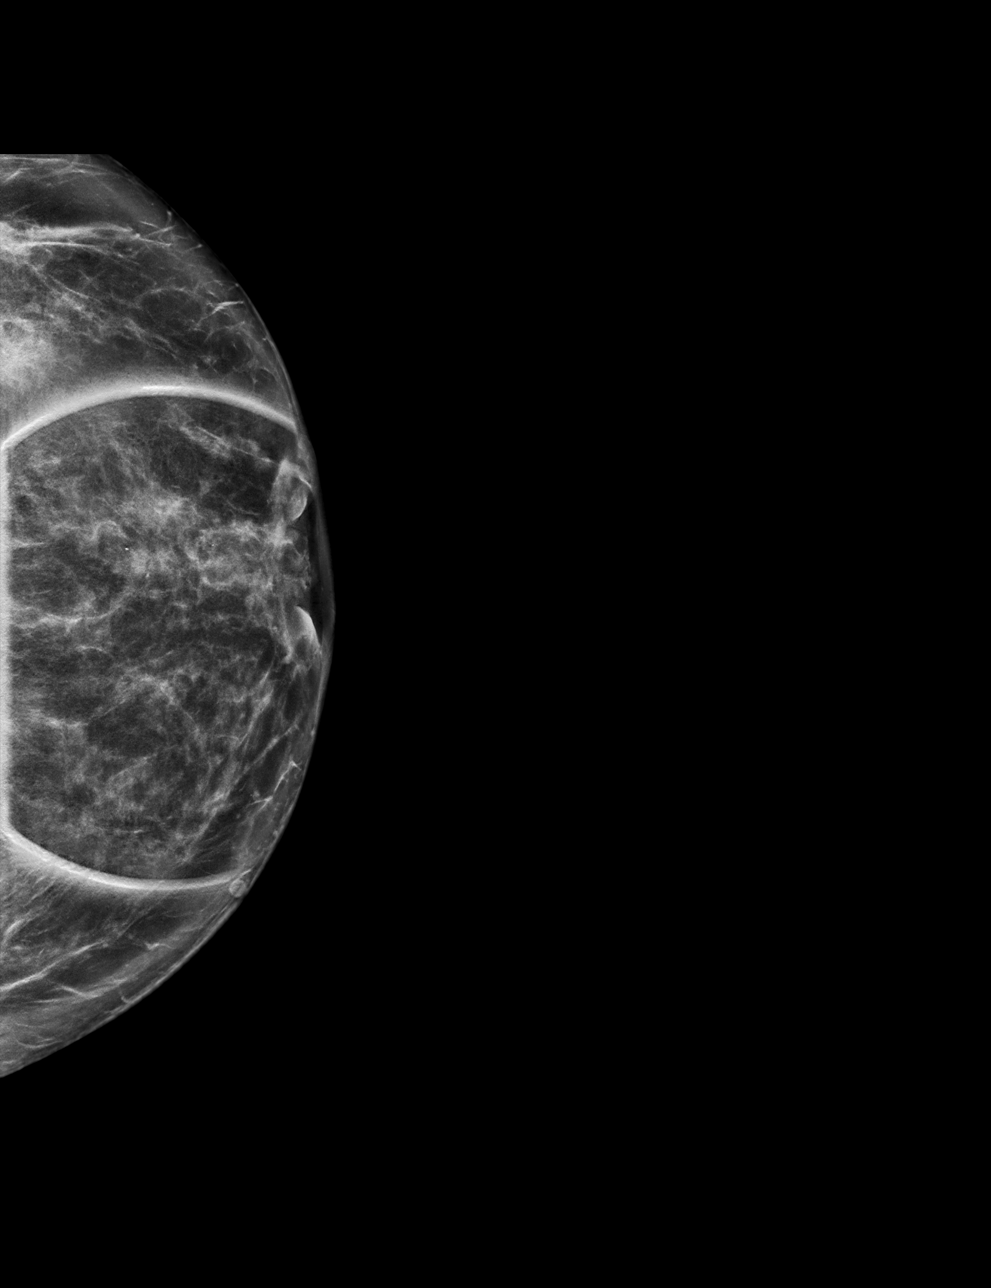

[L TAN synth-2D]
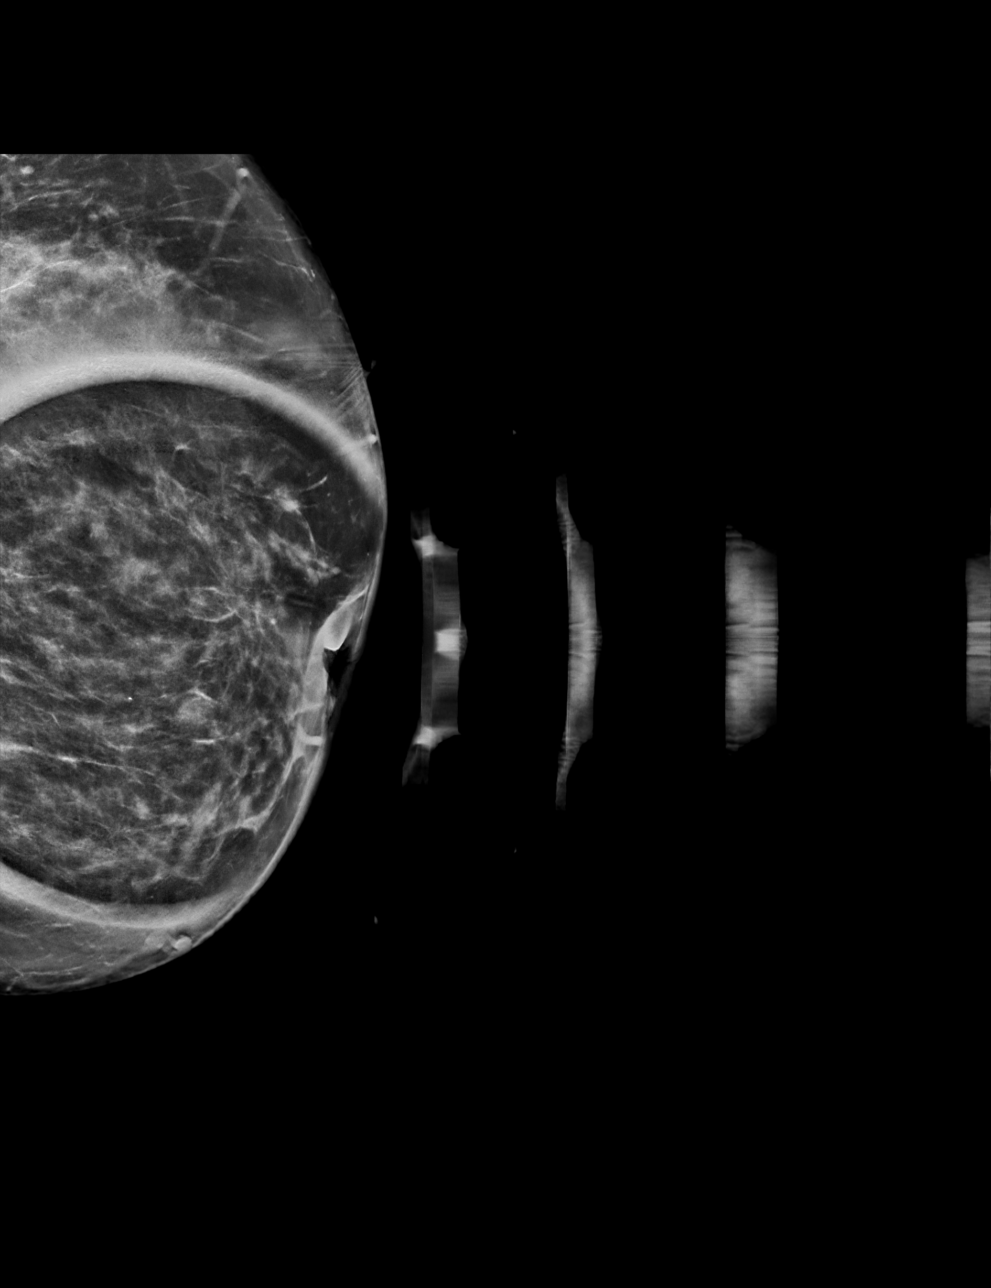

[L TAN tomo · tomo slice 41/82.0]
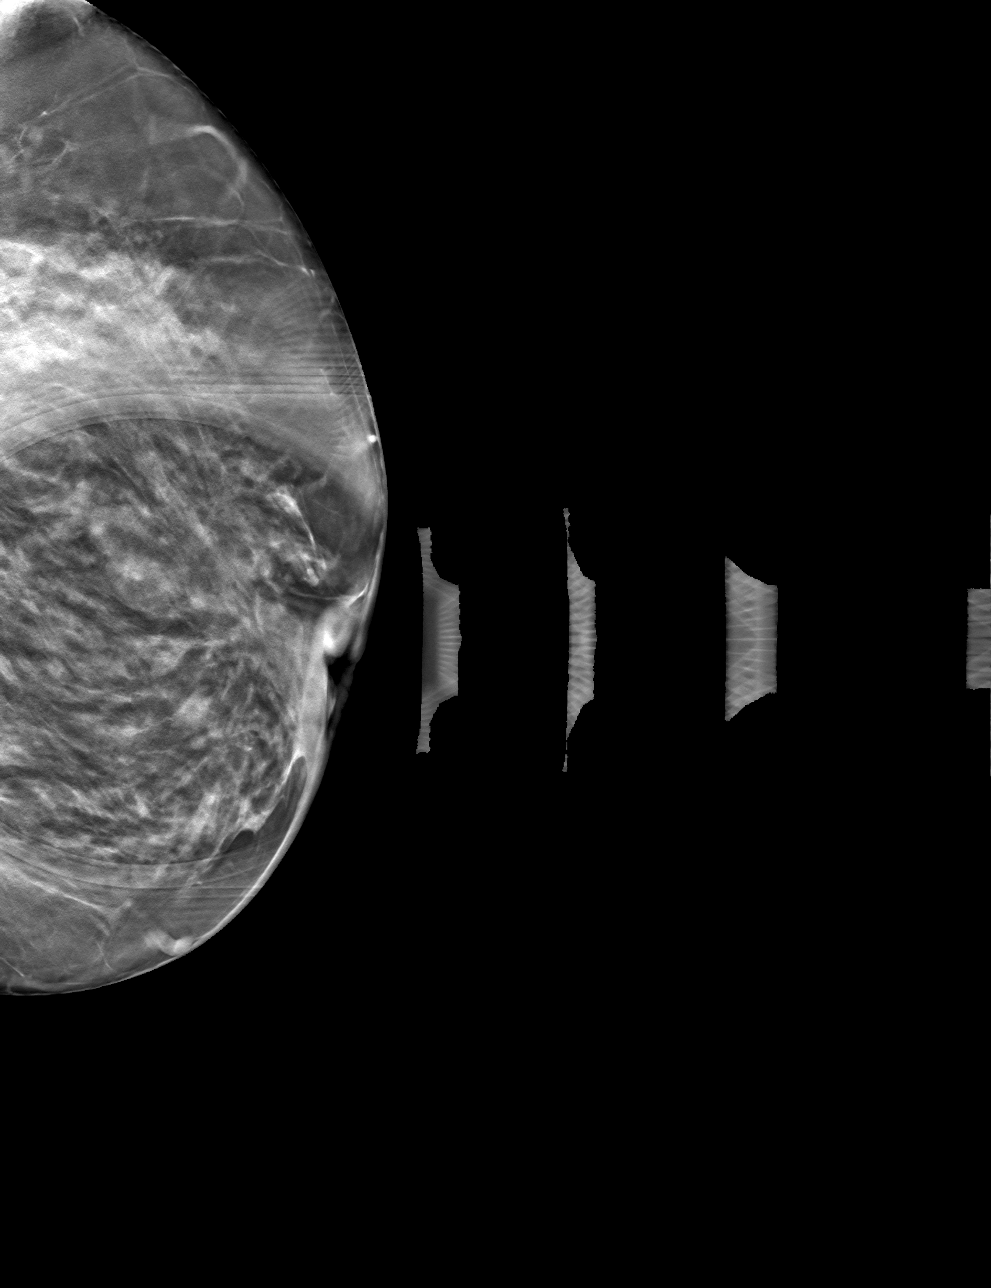

[L CC tomo · tomo slice 37/73.0]
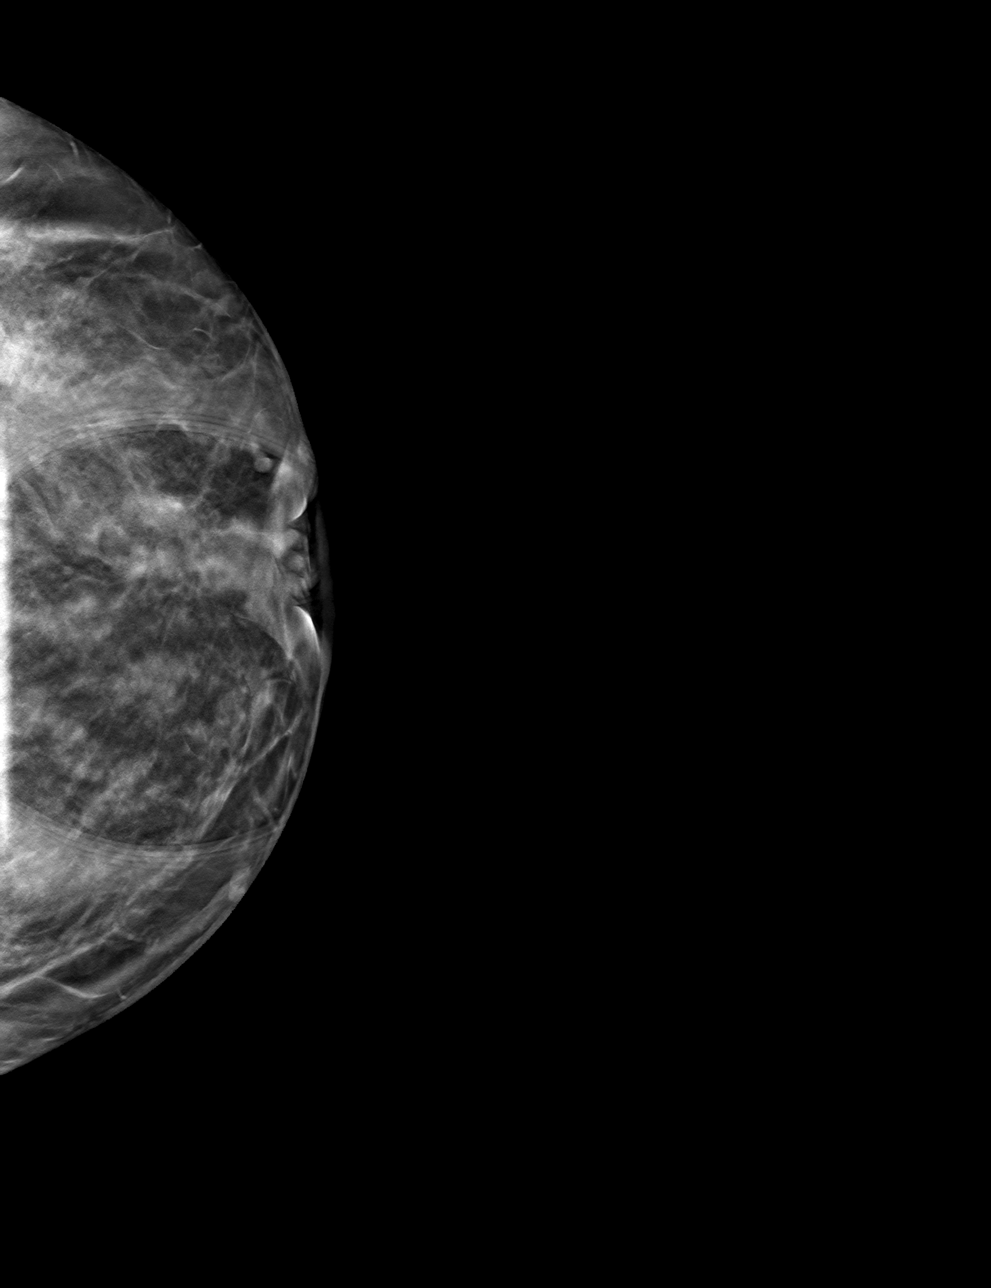

[4 of 12 positions shown; findings below may reference images not displayed]

ACR Breast Density Category c: The breast tissue is heterogeneously
dense, which may obscure small masses.
FINDINGS: There is an asymmetry in the left retroareolar region on the cc view
only. This is particularly well defined. No discrete mass.

Mammographic images were processed with CAD.

On physical exam, left nipple inversion is identified. The patient
says the inversion is new.

Targeted ultrasound is performed, showing no definitive sonographic
correlate for the left breast asymmetry or the patient's nipple
inversion.
IMPRESSION: There is a subtle ill-defined asymmetry in the left retroareolar
region on the cc view only. Given the subtle nature of this finding,
MRI is recommended to evaluate the new inversion. If MRI does not
demonstrate a discrete biopsy target, recommend stereotactic biopsy
of the left breast asymmetry.

RECOMMENDATION:
Recommend breast MRI to evaluate the patient's new nipple inversion.
If the MRI does not demonstrate a discrete biopsy target, recommend
stereotactic biopsy of the left breast asymmetry behind the nipple
seen on the cc view.

I have discussed the findings and recommendations with the patient.
If applicable, a reminder letter will be sent to the patient
regarding the next appointment.

BI-RADS CATEGORY  4: Suspicious.

## 2020-08-20 DIAGNOSIS — H2511 Age-related nuclear cataract, right eye: Secondary | ICD-10-CM | POA: Diagnosis not present

## 2020-08-20 DIAGNOSIS — H2512 Age-related nuclear cataract, left eye: Secondary | ICD-10-CM | POA: Diagnosis not present

## 2020-09-02 DIAGNOSIS — H25811 Combined forms of age-related cataract, right eye: Secondary | ICD-10-CM | POA: Diagnosis not present

## 2020-09-02 DIAGNOSIS — H2511 Age-related nuclear cataract, right eye: Secondary | ICD-10-CM | POA: Diagnosis not present

## 2020-09-02 DIAGNOSIS — H25812 Combined forms of age-related cataract, left eye: Secondary | ICD-10-CM | POA: Diagnosis not present

## 2021-01-18 DIAGNOSIS — N6122 Granulomatous mastitis, left breast: Secondary | ICD-10-CM | POA: Diagnosis not present

## 2021-01-24 DIAGNOSIS — M47892 Other spondylosis, cervical region: Secondary | ICD-10-CM | POA: Diagnosis not present

## 2021-01-24 DIAGNOSIS — M9901 Segmental and somatic dysfunction of cervical region: Secondary | ICD-10-CM | POA: Diagnosis not present

## 2021-01-24 DIAGNOSIS — R293 Abnormal posture: Secondary | ICD-10-CM | POA: Diagnosis not present

## 2021-01-24 DIAGNOSIS — M9902 Segmental and somatic dysfunction of thoracic region: Secondary | ICD-10-CM | POA: Diagnosis not present

## 2021-01-24 DIAGNOSIS — M7912 Myalgia of auxiliary muscles, head and neck: Secondary | ICD-10-CM | POA: Diagnosis not present

## 2021-01-24 DIAGNOSIS — M5383 Other specified dorsopathies, cervicothoracic region: Secondary | ICD-10-CM | POA: Diagnosis not present

## 2021-01-24 DIAGNOSIS — M9903 Segmental and somatic dysfunction of lumbar region: Secondary | ICD-10-CM | POA: Diagnosis not present

## 2021-01-27 DIAGNOSIS — M9903 Segmental and somatic dysfunction of lumbar region: Secondary | ICD-10-CM | POA: Diagnosis not present

## 2021-01-27 DIAGNOSIS — M9902 Segmental and somatic dysfunction of thoracic region: Secondary | ICD-10-CM | POA: Diagnosis not present

## 2021-01-27 DIAGNOSIS — M9901 Segmental and somatic dysfunction of cervical region: Secondary | ICD-10-CM | POA: Diagnosis not present

## 2021-01-27 DIAGNOSIS — M7912 Myalgia of auxiliary muscles, head and neck: Secondary | ICD-10-CM | POA: Diagnosis not present

## 2021-01-27 DIAGNOSIS — R293 Abnormal posture: Secondary | ICD-10-CM | POA: Diagnosis not present

## 2021-01-27 DIAGNOSIS — M47892 Other spondylosis, cervical region: Secondary | ICD-10-CM | POA: Diagnosis not present

## 2021-01-27 DIAGNOSIS — M5383 Other specified dorsopathies, cervicothoracic region: Secondary | ICD-10-CM | POA: Diagnosis not present

## 2021-02-01 DIAGNOSIS — M9901 Segmental and somatic dysfunction of cervical region: Secondary | ICD-10-CM | POA: Diagnosis not present

## 2021-02-01 DIAGNOSIS — M9903 Segmental and somatic dysfunction of lumbar region: Secondary | ICD-10-CM | POA: Diagnosis not present

## 2021-02-01 DIAGNOSIS — M7912 Myalgia of auxiliary muscles, head and neck: Secondary | ICD-10-CM | POA: Diagnosis not present

## 2021-02-01 DIAGNOSIS — R293 Abnormal posture: Secondary | ICD-10-CM | POA: Diagnosis not present

## 2021-02-01 DIAGNOSIS — M9902 Segmental and somatic dysfunction of thoracic region: Secondary | ICD-10-CM | POA: Diagnosis not present

## 2021-02-01 DIAGNOSIS — M5383 Other specified dorsopathies, cervicothoracic region: Secondary | ICD-10-CM | POA: Diagnosis not present

## 2021-02-01 DIAGNOSIS — M47892 Other spondylosis, cervical region: Secondary | ICD-10-CM | POA: Diagnosis not present

## 2021-02-02 DIAGNOSIS — M5383 Other specified dorsopathies, cervicothoracic region: Secondary | ICD-10-CM | POA: Diagnosis not present

## 2021-02-02 DIAGNOSIS — M9902 Segmental and somatic dysfunction of thoracic region: Secondary | ICD-10-CM | POA: Diagnosis not present

## 2021-02-02 DIAGNOSIS — M7912 Myalgia of auxiliary muscles, head and neck: Secondary | ICD-10-CM | POA: Diagnosis not present

## 2021-02-02 DIAGNOSIS — R293 Abnormal posture: Secondary | ICD-10-CM | POA: Diagnosis not present

## 2021-02-02 DIAGNOSIS — M9903 Segmental and somatic dysfunction of lumbar region: Secondary | ICD-10-CM | POA: Diagnosis not present

## 2021-02-02 DIAGNOSIS — M9901 Segmental and somatic dysfunction of cervical region: Secondary | ICD-10-CM | POA: Diagnosis not present

## 2021-02-02 DIAGNOSIS — M47892 Other spondylosis, cervical region: Secondary | ICD-10-CM | POA: Diagnosis not present

## 2021-02-03 DIAGNOSIS — M47892 Other spondylosis, cervical region: Secondary | ICD-10-CM | POA: Diagnosis not present

## 2021-02-03 DIAGNOSIS — M9901 Segmental and somatic dysfunction of cervical region: Secondary | ICD-10-CM | POA: Diagnosis not present

## 2021-02-03 DIAGNOSIS — M9902 Segmental and somatic dysfunction of thoracic region: Secondary | ICD-10-CM | POA: Diagnosis not present

## 2021-02-03 DIAGNOSIS — M9903 Segmental and somatic dysfunction of lumbar region: Secondary | ICD-10-CM | POA: Diagnosis not present

## 2021-02-03 DIAGNOSIS — M7912 Myalgia of auxiliary muscles, head and neck: Secondary | ICD-10-CM | POA: Diagnosis not present

## 2021-02-03 DIAGNOSIS — M5383 Other specified dorsopathies, cervicothoracic region: Secondary | ICD-10-CM | POA: Diagnosis not present

## 2021-02-03 DIAGNOSIS — R293 Abnormal posture: Secondary | ICD-10-CM | POA: Diagnosis not present

## 2021-02-07 DIAGNOSIS — M47892 Other spondylosis, cervical region: Secondary | ICD-10-CM | POA: Diagnosis not present

## 2021-02-07 DIAGNOSIS — M9902 Segmental and somatic dysfunction of thoracic region: Secondary | ICD-10-CM | POA: Diagnosis not present

## 2021-02-07 DIAGNOSIS — M9901 Segmental and somatic dysfunction of cervical region: Secondary | ICD-10-CM | POA: Diagnosis not present

## 2021-02-07 DIAGNOSIS — M9903 Segmental and somatic dysfunction of lumbar region: Secondary | ICD-10-CM | POA: Diagnosis not present

## 2021-02-07 DIAGNOSIS — R293 Abnormal posture: Secondary | ICD-10-CM | POA: Diagnosis not present

## 2021-02-07 DIAGNOSIS — M5383 Other specified dorsopathies, cervicothoracic region: Secondary | ICD-10-CM | POA: Diagnosis not present

## 2021-02-07 DIAGNOSIS — M7912 Myalgia of auxiliary muscles, head and neck: Secondary | ICD-10-CM | POA: Diagnosis not present

## 2021-02-09 DIAGNOSIS — T783XXA Angioneurotic edema, initial encounter: Secondary | ICD-10-CM | POA: Diagnosis not present

## 2021-02-09 DIAGNOSIS — R0602 Shortness of breath: Secondary | ICD-10-CM | POA: Diagnosis not present

## 2021-02-09 DIAGNOSIS — T782XXA Anaphylactic shock, unspecified, initial encounter: Secondary | ICD-10-CM | POA: Diagnosis not present

## 2021-02-10 DIAGNOSIS — R293 Abnormal posture: Secondary | ICD-10-CM | POA: Diagnosis not present

## 2021-02-10 DIAGNOSIS — M47892 Other spondylosis, cervical region: Secondary | ICD-10-CM | POA: Diagnosis not present

## 2021-02-10 DIAGNOSIS — M9902 Segmental and somatic dysfunction of thoracic region: Secondary | ICD-10-CM | POA: Diagnosis not present

## 2021-02-10 DIAGNOSIS — M9901 Segmental and somatic dysfunction of cervical region: Secondary | ICD-10-CM | POA: Diagnosis not present

## 2021-02-10 DIAGNOSIS — M7912 Myalgia of auxiliary muscles, head and neck: Secondary | ICD-10-CM | POA: Diagnosis not present

## 2021-02-10 DIAGNOSIS — M5383 Other specified dorsopathies, cervicothoracic region: Secondary | ICD-10-CM | POA: Diagnosis not present

## 2021-02-10 DIAGNOSIS — M9903 Segmental and somatic dysfunction of lumbar region: Secondary | ICD-10-CM | POA: Diagnosis not present

## 2021-02-14 DIAGNOSIS — M5383 Other specified dorsopathies, cervicothoracic region: Secondary | ICD-10-CM | POA: Diagnosis not present

## 2021-02-14 DIAGNOSIS — M9903 Segmental and somatic dysfunction of lumbar region: Secondary | ICD-10-CM | POA: Diagnosis not present

## 2021-02-14 DIAGNOSIS — R293 Abnormal posture: Secondary | ICD-10-CM | POA: Diagnosis not present

## 2021-02-14 DIAGNOSIS — M47892 Other spondylosis, cervical region: Secondary | ICD-10-CM | POA: Diagnosis not present

## 2021-02-14 DIAGNOSIS — M9901 Segmental and somatic dysfunction of cervical region: Secondary | ICD-10-CM | POA: Diagnosis not present

## 2021-02-14 DIAGNOSIS — M9902 Segmental and somatic dysfunction of thoracic region: Secondary | ICD-10-CM | POA: Diagnosis not present

## 2021-02-14 DIAGNOSIS — M7912 Myalgia of auxiliary muscles, head and neck: Secondary | ICD-10-CM | POA: Diagnosis not present

## 2021-02-17 DIAGNOSIS — M47892 Other spondylosis, cervical region: Secondary | ICD-10-CM | POA: Diagnosis not present

## 2021-02-17 DIAGNOSIS — M9903 Segmental and somatic dysfunction of lumbar region: Secondary | ICD-10-CM | POA: Diagnosis not present

## 2021-02-17 DIAGNOSIS — R293 Abnormal posture: Secondary | ICD-10-CM | POA: Diagnosis not present

## 2021-02-17 DIAGNOSIS — M5383 Other specified dorsopathies, cervicothoracic region: Secondary | ICD-10-CM | POA: Diagnosis not present

## 2021-02-17 DIAGNOSIS — M9901 Segmental and somatic dysfunction of cervical region: Secondary | ICD-10-CM | POA: Diagnosis not present

## 2021-02-17 DIAGNOSIS — M7912 Myalgia of auxiliary muscles, head and neck: Secondary | ICD-10-CM | POA: Diagnosis not present

## 2021-02-17 DIAGNOSIS — M9902 Segmental and somatic dysfunction of thoracic region: Secondary | ICD-10-CM | POA: Diagnosis not present

## 2021-02-24 DIAGNOSIS — M9901 Segmental and somatic dysfunction of cervical region: Secondary | ICD-10-CM | POA: Diagnosis not present

## 2021-02-24 DIAGNOSIS — M5383 Other specified dorsopathies, cervicothoracic region: Secondary | ICD-10-CM | POA: Diagnosis not present

## 2021-02-24 DIAGNOSIS — R293 Abnormal posture: Secondary | ICD-10-CM | POA: Diagnosis not present

## 2021-02-24 DIAGNOSIS — M47892 Other spondylosis, cervical region: Secondary | ICD-10-CM | POA: Diagnosis not present

## 2021-02-24 DIAGNOSIS — M9903 Segmental and somatic dysfunction of lumbar region: Secondary | ICD-10-CM | POA: Diagnosis not present

## 2021-02-24 DIAGNOSIS — M7912 Myalgia of auxiliary muscles, head and neck: Secondary | ICD-10-CM | POA: Diagnosis not present

## 2021-02-24 DIAGNOSIS — M9902 Segmental and somatic dysfunction of thoracic region: Secondary | ICD-10-CM | POA: Diagnosis not present

## 2021-03-03 DIAGNOSIS — M9903 Segmental and somatic dysfunction of lumbar region: Secondary | ICD-10-CM | POA: Diagnosis not present

## 2021-03-03 DIAGNOSIS — M5383 Other specified dorsopathies, cervicothoracic region: Secondary | ICD-10-CM | POA: Diagnosis not present

## 2021-03-03 DIAGNOSIS — M7912 Myalgia of auxiliary muscles, head and neck: Secondary | ICD-10-CM | POA: Diagnosis not present

## 2021-03-03 DIAGNOSIS — M9902 Segmental and somatic dysfunction of thoracic region: Secondary | ICD-10-CM | POA: Diagnosis not present

## 2021-03-03 DIAGNOSIS — M9901 Segmental and somatic dysfunction of cervical region: Secondary | ICD-10-CM | POA: Diagnosis not present

## 2021-03-03 DIAGNOSIS — M47892 Other spondylosis, cervical region: Secondary | ICD-10-CM | POA: Diagnosis not present

## 2021-03-03 DIAGNOSIS — R293 Abnormal posture: Secondary | ICD-10-CM | POA: Diagnosis not present

## 2021-03-09 DIAGNOSIS — R293 Abnormal posture: Secondary | ICD-10-CM | POA: Diagnosis not present

## 2021-03-09 DIAGNOSIS — M47892 Other spondylosis, cervical region: Secondary | ICD-10-CM | POA: Diagnosis not present

## 2021-03-09 DIAGNOSIS — M9902 Segmental and somatic dysfunction of thoracic region: Secondary | ICD-10-CM | POA: Diagnosis not present

## 2021-03-09 DIAGNOSIS — M7912 Myalgia of auxiliary muscles, head and neck: Secondary | ICD-10-CM | POA: Diagnosis not present

## 2021-03-09 DIAGNOSIS — M9901 Segmental and somatic dysfunction of cervical region: Secondary | ICD-10-CM | POA: Diagnosis not present

## 2021-03-09 DIAGNOSIS — M9903 Segmental and somatic dysfunction of lumbar region: Secondary | ICD-10-CM | POA: Diagnosis not present

## 2021-03-09 DIAGNOSIS — M5383 Other specified dorsopathies, cervicothoracic region: Secondary | ICD-10-CM | POA: Diagnosis not present

## 2021-03-10 ENCOUNTER — Encounter: Payer: Self-pay | Admitting: Allergy and Immunology

## 2021-03-10 ENCOUNTER — Ambulatory Visit: Payer: Medicare HMO | Admitting: Allergy and Immunology

## 2021-03-10 ENCOUNTER — Other Ambulatory Visit: Payer: Self-pay

## 2021-03-10 VITALS — BP 118/72 | HR 63 | Resp 16 | Ht 62.5 in | Wt 146.0 lb

## 2021-03-10 DIAGNOSIS — T782XXD Anaphylactic shock, unspecified, subsequent encounter: Secondary | ICD-10-CM | POA: Diagnosis not present

## 2021-03-10 DIAGNOSIS — T781XXD Other adverse food reactions, not elsewhere classified, subsequent encounter: Secondary | ICD-10-CM | POA: Diagnosis not present

## 2021-03-10 NOTE — Progress Notes (Signed)
[place] - [place] - [place] - [place] - [place]   Dear Dr. Lorelei Pont,  Thank you for referring Suzan Nailer to the [facility] on 03/10/2021.   Below is a summation of this patient's evaluation and recommendations.  Thank you for your referral. I will keep you informed about this patient's response to treatment.   If you have any questions please do not hesitate to contact me.   Sincerely,  Jiles Prows, MD Allergy / Immunology [place]   ______________________________________________________________________    NEW PATIENT NOTE  Referring Provider: Darreld Mclean, MD Primary Provider: Darreld Mclean, MD Date of office visit: 03/10/2021    Subjective:   Chief Complaint:  BENITO KUEKER (DOB: [birth year]) is a 75 y.o. female who presents to the clinic on 03/10/2021 with a chief complaint of Allergic Reaction (Insect bite/sting) .     HPI: Raford Pitcher presents to this clinic in evaluation of allergic reaction that occurred on 09 February 2021.  At around 7 PM at night, after eating some cheese and crackers for dinner which is her common meal, she developed diffuse urticaria and lip swelling and periorbital swelling and had a funny sensation in her tongue, although she had no difficulty swallowing or speaking, requiring her to go to the emergency room and received 2 injectable epinephrine treatments.  Her reaction resolved within about 2 to 3 hours.  She was subsequently given some prednisone.  She has not had a reaction since.  There was not an obvious provoking factor that gave rise to that reaction.  She did not have any animals exposure or stings as far as she knew, although she apparently developed a pretty red area on her throat as though something had stung her in that area.  She did not have any unusual environmental exposure such as particulate matter inhalation,  she did not take a nonsteroidal anti-inflammatory drug or other over-the-counter medication, she did not start any new medications, and she did not have symptoms to suggest an ongoing infectious disease.  However, starting sometime in July and through this reaction she had diffuse body aches predominantly affecting her shoulder girdle for which she was taking a supplement called "relief factor".  She took this supplement for about 3 weeks.  It contained icariin, resveratrol, tumeric, omega-3.  She does have a history of having large local reactions to fire ant stings but it does not go any further than large local reaction and never develops any associated systemic or constitutional symptoms.  She does have a history of having eggs giving rise to "asthma attacks as a child".  She has not eaten egg her whole life.  Sometimes she will get nausea if she eats a food product that has a baked egg contained within that product.  Past Medical History:  Diagnosis Date   Asthma    Chicken pox    Colon polyps    Hypothyroidism    Urticaria     Past Surgical History:  Procedure Laterality Date   BREAST CYST ASPIRATION Left 35 yrs ago   [place]     childhood    Allergies as of 03/10/2021       Reactions   Eggs Or Egg-derived Products Other (See Comments)   Penicillin G Other (See Comments)   States since allergic to eggs can not use this      Medication  List    estradiol 2 MG tablet Commonly known as: ESTRACE Take 2 mg by mouth.   metroNIDAZOLE 0.75 % cream Commonly known as: METROCREAM Use as directed twice daily   progesterone 200 MG capsule Commonly known as: PROMETRIUM Take 1 capsule by mouth daily.   thyroid 120 MG tablet Commonly known as: ARMOUR TAKE 1 TABLET BY MOUTH DAILY IN THE MORNING   vitamin E 180 MG (400 UNITS) capsule Take by mouth.    Review of systems negative except as noted in HPI / PMHx or noted below:  Review of  Systems  Constitutional: Negative.   HENT: Negative.    Eyes: Negative.   Respiratory: Negative.    Cardiovascular: Negative.   Gastrointestinal: Negative.   Genitourinary: Negative.   Musculoskeletal: Negative.   Skin: Negative.   Neurological: Negative.   Endo/Heme/Allergies: Negative.   Psychiatric/Behavioral: Negative.     Family History  Problem Relation Age of Onset   Pancreatic cancer Mother    Aneurysm Father    Breast cancer Sister 22   Skin cancer Brother    Heart attack Brother    Breast cancer Sister 10   Hepatitis C Brother    Skin cancer Brother     Social History   Socioeconomic History   Marital status: Married    Spouse name: Not on file   Number of children: Not on file   Years of education: Not on file   Highest education level: Not on file  Occupational History   Not on file  Tobacco Use   Smoking status: Never   Smokeless tobacco: Never  Substance and Sexual Activity   Alcohol use: Yes   Drug use: Not on file   Sexual activity: Not on file  Other Topics Concern   Not on file  Social History Narrative   Not on file   Environmental and Social history  Lives in a house with a dry environment, dogs look inside the household, carpet in the bedroom, no plastic on the bed, no plastic on the pillow, no smoking ongoing 5 the household.  She works as a Dietitian.  Objective:   Vitals:   03/10/21 1000  BP: 118/72  Pulse: 63  Resp: 16  SpO2: 98%   Height: 5' 2.5" (158.8 cm) Weight: 146 lb (66.2 kg)  Physical Exam Constitutional:      Appearance: She is not diaphoretic.  HENT:     Head: Normocephalic.     Right Ear: Tympanic membrane, ear canal and external ear normal.     Left Ear: Tympanic membrane, ear canal and external ear normal.     Nose: Nose normal. No mucosal edema or rhinorrhea.     Mouth/Throat:     Pharynx: Uvula midline. No oropharyngeal exudate.  Eyes:     Conjunctiva/sclera: Conjunctivae normal.  Neck:      Thyroid: No thyromegaly.     Trachea: Trachea normal. No tracheal tenderness or tracheal deviation.  Cardiovascular:     Rate and Rhythm: Normal rate and regular rhythm.     Heart sounds: Normal heart sounds, S1 normal and S2 normal. No murmur heard. Pulmonary:     Effort: No respiratory distress.     Breath sounds: Normal breath sounds. No stridor. No wheezing or rales.  Lymphadenopathy:     Head:     Right side of head: No tonsillar adenopathy.     Left side of head: No tonsillar adenopathy.     Cervical: No cervical adenopathy.  Skin:    Findings: No erythema or rash.     Nails: There is no clubbing.  Neurological:     Mental Status: She is alert.    Diagnostics: Allergy skin tests were performed.  She demonstrated hypersensitivity against egg.  Assessment and Plan:    1. Anaphylaxis, subsequent encounter     1.  Allergen avoidance measures? No "Relief Factor"  2.  EpiPen, Benadryl, MD/ER evaluation for allergic reaction.  3.  Blood - alpha-gal panel, egg components, venom panel, fire ant IgE  4.  Further evaluation?  Yes, if with recurrent reactions  Raford Pitcher appears to have had a anaphylactic reaction with unknown etiologic factor.  She is worried that she may have developed a venom sting reaction and we will work through that issue with the blood test noted above and also define her egg allergy and a little more detail by looking at egg components specific IgE antibodies and also ruling out alpha gal syndrome.  She did appear to have some constitutional symptoms with some arthralgic like issues that were occurring around the point in time in which she developed this reaction and she was taking this agent called "relief factor" and may be she had an adverse reaction directed against that agent or maybe she had some type of immunological hyperreactivity that was transient giving rise to her arthralgia.  Certainly she is going to require further evaluation if she has recurrent  anaphylactic reactions.  Jiles Prows, MD Allergy / Immunology [facility]

## 2021-03-10 NOTE — Patient Instructions (Addendum)
  1.  Allergen avoidance measures? No "Relief Factor"  2.  EpiPen, Benadryl, MD/ER evaluation for allergic reaction.  3.  Blood - alpha-gal panel, egg components, venom panel, fire ant IgE  4.  Further evaluation?  Yes, if with recurrent reactions

## 2021-03-15 ENCOUNTER — Encounter: Payer: Self-pay | Admitting: Allergy and Immunology

## 2021-03-16 ENCOUNTER — Encounter: Payer: Self-pay | Admitting: Allergy and Immunology

## 2021-03-16 DIAGNOSIS — M7912 Myalgia of auxiliary muscles, head and neck: Secondary | ICD-10-CM | POA: Diagnosis not present

## 2021-03-16 DIAGNOSIS — M47892 Other spondylosis, cervical region: Secondary | ICD-10-CM | POA: Diagnosis not present

## 2021-03-16 DIAGNOSIS — M9903 Segmental and somatic dysfunction of lumbar region: Secondary | ICD-10-CM | POA: Diagnosis not present

## 2021-03-16 DIAGNOSIS — R293 Abnormal posture: Secondary | ICD-10-CM | POA: Diagnosis not present

## 2021-03-16 DIAGNOSIS — M5383 Other specified dorsopathies, cervicothoracic region: Secondary | ICD-10-CM | POA: Diagnosis not present

## 2021-03-16 DIAGNOSIS — M9902 Segmental and somatic dysfunction of thoracic region: Secondary | ICD-10-CM | POA: Diagnosis not present

## 2021-03-16 DIAGNOSIS — M9901 Segmental and somatic dysfunction of cervical region: Secondary | ICD-10-CM | POA: Diagnosis not present

## 2021-03-17 LAB — ALLERGEN HYMENOPTERA PANEL
Bumblebee: <0.1 kU/L
Honeybee IgE: <0.1 kU/L
Hornet, White Face, IgE: 0.91 kU/L — AB
Hornet, Yellow, IgE: 0.8 kU/L — AB
Paper Wasp IgE: 14.7 kU/L — AB
Yellow Jacket, IgE: 6.63 kU/L — AB

## 2021-03-17 LAB — ALLERGEN FIRE ANT: I070-IgE Fire Ant (Invicta): 1.09 kU/L — AB

## 2021-03-17 LAB — EGG COMPONENT PANEL
F232-IgE Ovalbumin: 6.18 kU/L — AB
F233-IgE Ovomucoid: 0.94 kU/L — AB

## 2021-03-17 LAB — ALPHA-GAL PANEL
Allergen Lamb IgE: 0.11 kU/L — AB
Beef IgE: 0.37 kU/L — AB
IgE (Immunoglobulin E), Serum: 120 IU/mL (ref 6–495)
O215-IgE Alpha-Gal: 4.19 kU/L — AB
Pork IgE: 0.11 kU/L — AB

## 2021-03-21 ENCOUNTER — Telehealth: Payer: Self-pay | Admitting: Allergy and Immunology

## 2021-03-21 NOTE — Telephone Encounter (Signed)
Patient called to go over her lab results. Patient is requesting someone call her back.   [phone]

## 2021-03-21 NOTE — Telephone Encounter (Signed)
Called and reviewed lab results with patient. Patient verbalized understanding and will call back if she is interested in starting venom injections.

## 2021-03-23 DIAGNOSIS — M5383 Other specified dorsopathies, cervicothoracic region: Secondary | ICD-10-CM | POA: Diagnosis not present

## 2021-03-23 DIAGNOSIS — M9901 Segmental and somatic dysfunction of cervical region: Secondary | ICD-10-CM | POA: Diagnosis not present

## 2021-03-23 DIAGNOSIS — M7912 Myalgia of auxiliary muscles, head and neck: Secondary | ICD-10-CM | POA: Diagnosis not present

## 2021-03-23 DIAGNOSIS — M9903 Segmental and somatic dysfunction of lumbar region: Secondary | ICD-10-CM | POA: Diagnosis not present

## 2021-03-23 DIAGNOSIS — M47892 Other spondylosis, cervical region: Secondary | ICD-10-CM | POA: Diagnosis not present

## 2021-03-23 DIAGNOSIS — R293 Abnormal posture: Secondary | ICD-10-CM | POA: Diagnosis not present

## 2021-03-23 DIAGNOSIS — M9902 Segmental and somatic dysfunction of thoracic region: Secondary | ICD-10-CM | POA: Diagnosis not present

## 2021-03-29 ENCOUNTER — Ambulatory Visit: Payer: PPO | Admitting: Allergy

## 2021-03-30 DIAGNOSIS — R293 Abnormal posture: Secondary | ICD-10-CM | POA: Diagnosis not present

## 2021-03-30 DIAGNOSIS — M9903 Segmental and somatic dysfunction of lumbar region: Secondary | ICD-10-CM | POA: Diagnosis not present

## 2021-03-30 DIAGNOSIS — M5383 Other specified dorsopathies, cervicothoracic region: Secondary | ICD-10-CM | POA: Diagnosis not present

## 2021-03-30 DIAGNOSIS — M9901 Segmental and somatic dysfunction of cervical region: Secondary | ICD-10-CM | POA: Diagnosis not present

## 2021-03-30 DIAGNOSIS — M47892 Other spondylosis, cervical region: Secondary | ICD-10-CM | POA: Diagnosis not present

## 2021-03-30 DIAGNOSIS — M9902 Segmental and somatic dysfunction of thoracic region: Secondary | ICD-10-CM | POA: Diagnosis not present

## 2021-03-30 DIAGNOSIS — M7912 Myalgia of auxiliary muscles, head and neck: Secondary | ICD-10-CM | POA: Diagnosis not present

## 2021-04-06 DIAGNOSIS — M5383 Other specified dorsopathies, cervicothoracic region: Secondary | ICD-10-CM | POA: Diagnosis not present

## 2021-04-06 DIAGNOSIS — M9902 Segmental and somatic dysfunction of thoracic region: Secondary | ICD-10-CM | POA: Diagnosis not present

## 2021-04-06 DIAGNOSIS — M9903 Segmental and somatic dysfunction of lumbar region: Secondary | ICD-10-CM | POA: Diagnosis not present

## 2021-04-06 DIAGNOSIS — M7912 Myalgia of auxiliary muscles, head and neck: Secondary | ICD-10-CM | POA: Diagnosis not present

## 2021-04-06 DIAGNOSIS — R293 Abnormal posture: Secondary | ICD-10-CM | POA: Diagnosis not present

## 2021-04-06 DIAGNOSIS — M47892 Other spondylosis, cervical region: Secondary | ICD-10-CM | POA: Diagnosis not present

## 2021-04-06 DIAGNOSIS — M9901 Segmental and somatic dysfunction of cervical region: Secondary | ICD-10-CM | POA: Diagnosis not present

## 2021-04-20 DIAGNOSIS — M7912 Myalgia of auxiliary muscles, head and neck: Secondary | ICD-10-CM | POA: Diagnosis not present

## 2021-04-20 DIAGNOSIS — M9902 Segmental and somatic dysfunction of thoracic region: Secondary | ICD-10-CM | POA: Diagnosis not present

## 2021-04-20 DIAGNOSIS — M5383 Other specified dorsopathies, cervicothoracic region: Secondary | ICD-10-CM | POA: Diagnosis not present

## 2021-04-20 DIAGNOSIS — M9903 Segmental and somatic dysfunction of lumbar region: Secondary | ICD-10-CM | POA: Diagnosis not present

## 2021-04-20 DIAGNOSIS — M47892 Other spondylosis, cervical region: Secondary | ICD-10-CM | POA: Diagnosis not present

## 2021-04-20 DIAGNOSIS — M9901 Segmental and somatic dysfunction of cervical region: Secondary | ICD-10-CM | POA: Diagnosis not present

## 2021-04-20 DIAGNOSIS — R293 Abnormal posture: Secondary | ICD-10-CM | POA: Diagnosis not present

## 2021-04-26 DIAGNOSIS — R293 Abnormal posture: Secondary | ICD-10-CM | POA: Diagnosis not present

## 2021-04-26 DIAGNOSIS — M9901 Segmental and somatic dysfunction of cervical region: Secondary | ICD-10-CM | POA: Diagnosis not present

## 2021-04-26 DIAGNOSIS — M9903 Segmental and somatic dysfunction of lumbar region: Secondary | ICD-10-CM | POA: Diagnosis not present

## 2021-04-26 DIAGNOSIS — M9902 Segmental and somatic dysfunction of thoracic region: Secondary | ICD-10-CM | POA: Diagnosis not present

## 2021-04-26 DIAGNOSIS — M47892 Other spondylosis, cervical region: Secondary | ICD-10-CM | POA: Diagnosis not present

## 2021-04-26 DIAGNOSIS — M5383 Other specified dorsopathies, cervicothoracic region: Secondary | ICD-10-CM | POA: Diagnosis not present

## 2021-04-26 DIAGNOSIS — M7912 Myalgia of auxiliary muscles, head and neck: Secondary | ICD-10-CM | POA: Diagnosis not present

## 2021-05-11 DIAGNOSIS — M9902 Segmental and somatic dysfunction of thoracic region: Secondary | ICD-10-CM | POA: Diagnosis not present

## 2021-05-11 DIAGNOSIS — M9901 Segmental and somatic dysfunction of cervical region: Secondary | ICD-10-CM | POA: Diagnosis not present

## 2021-05-11 DIAGNOSIS — M9903 Segmental and somatic dysfunction of lumbar region: Secondary | ICD-10-CM | POA: Diagnosis not present

## 2021-05-11 DIAGNOSIS — R293 Abnormal posture: Secondary | ICD-10-CM | POA: Diagnosis not present

## 2021-05-11 DIAGNOSIS — M47892 Other spondylosis, cervical region: Secondary | ICD-10-CM | POA: Diagnosis not present

## 2021-05-11 DIAGNOSIS — M7912 Myalgia of auxiliary muscles, head and neck: Secondary | ICD-10-CM | POA: Diagnosis not present

## 2021-05-11 DIAGNOSIS — M5383 Other specified dorsopathies, cervicothoracic region: Secondary | ICD-10-CM | POA: Diagnosis not present

## 2021-05-16 DIAGNOSIS — H43811 Vitreous degeneration, right eye: Secondary | ICD-10-CM | POA: Diagnosis not present

## 2021-05-19 DIAGNOSIS — H04123 Dry eye syndrome of bilateral lacrimal glands: Secondary | ICD-10-CM | POA: Diagnosis not present

## 2021-05-19 DIAGNOSIS — H43813 Vitreous degeneration, bilateral: Secondary | ICD-10-CM | POA: Diagnosis not present

## 2021-06-07 ENCOUNTER — Ambulatory Visit (INDEPENDENT_AMBULATORY_CARE_PROVIDER_SITE_OTHER): Payer: Medicare HMO | Admitting: Family Medicine

## 2021-06-07 ENCOUNTER — Other Ambulatory Visit: Payer: Self-pay

## 2021-06-07 VITALS — BP 136/76 | HR 88 | Temp 97.3°F | Ht 61.5 in | Wt 153.0 lb

## 2021-06-07 DIAGNOSIS — Z6828 Body mass index (BMI) 28.0-28.9, adult: Secondary | ICD-10-CM | POA: Diagnosis not present

## 2021-06-07 DIAGNOSIS — E781 Pure hyperglyceridemia: Secondary | ICD-10-CM | POA: Diagnosis not present

## 2021-06-07 DIAGNOSIS — R5383 Other fatigue: Secondary | ICD-10-CM | POA: Insufficient documentation

## 2021-06-07 DIAGNOSIS — E039 Hypothyroidism, unspecified: Secondary | ICD-10-CM | POA: Diagnosis not present

## 2021-06-07 NOTE — Progress Notes (Signed)
New patient visit   Patient: Maureen Obrien   DOB: 1946/02/25   75 y.o. Female  MRN: [id number] Visit Date: 06/07/2021  Today's healthcare provider: Rochel Brome, MD   Chief Complaint  Patient presents with   Hypothyroidism   Subjective    Maureen Obrien is a 75 y.o. female who presents today as a new patient to establish care.  HPI  Pt is a 75 yo female with history of hypothyroidism, vitiligo, granulomatous mastitis, and neck pain.  Hypothyroidism: armour thyroid 90 mg once daily in am.  Neck pain: goes to [facility].  Her last physical was 07/2019, mammogram 12/19/2020.  Pt eats healthy and exercises.  Pt had cholesterol checked last in 10/2019 and her triglycerides were elevated at 217. LDL and HDL were good at that time.   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   Hypothyroidism    Hypothyroidism    Urticaria    Vitiligo    Past Surgical History:  Procedure Laterality Date   BREAST CYST ASPIRATION Left 96 yrs ago   [place]     childhood   Family Status  Relation Name Status   Mother  Deceased at age 75   Father  Deceased at age 75   Sister  Deceased at age 66   Sister  Deceased at age 39   Brother  67   Brother  Alive   Family History  Problem Relation Age of Onset   Pancreatic cancer Mother    Aneurysm Father    Breast cancer Sister 30   Breast cancer Sister 45   Skin cancer Brother    Heart attack Brother    Hepatitis C Brother    Skin cancer Brother    Social History   Socioeconomic History   Marital status: Married    Spouse name: Not on file   Number of children: Not on file   Years of education: Not on file   Highest education level: Not on file  Occupational History   Not on file  Tobacco Use   Smoking status: Never   Smokeless tobacco: Never  Substance and Sexual Activity   Alcohol use: Yes     Alcohol/week: 14.0 standard drinks    Types: 14 Glasses of wine per week   Drug use: Never   Sexual activity: Not on file  Other Topics Concern   Not on file  Social History Narrative   Not on file   Social Determinants of Health   Financial Resource Strain: Not on file  Food Insecurity: Not on file  Transportation Needs: Not on file  Physical Activity: Not on file  Stress: Not on file  Social Connections: Not on file   Outpatient Medications Prior to Visit  Medication Sig   Amino Acids (AMINO ACID PO) Take by mouth.   Ascorbic Acid (VITAMIN C) 1000 MG tablet Take 1,000 mg by mouth daily.   CHELATED MAGNESIUM PO Take by mouth.   Cholecalciferol (VITAMIN D) 125 MCG (5000 UT) CAPS Take by mouth daily.   co-enzyme Q-10 30 MG capsule Take 30 mg by mouth 3 (three) times daily.   ELDERBERRY PO Take by mouth.   estradiol (ESTRACE) 2 MG tablet Take 2 mg by mouth.   glucosamine-chondroitin 500-400 MG tablet Take 1 tablet by mouth 3 (three) times daily.  Homeopathic Products (LIVER SUPPORT SL) Place under the tongue.   metroNIDAZOLE (METROCREAM) 0.75 % cream Use as directed twice daily   Omega-3 Fatty Acids (SALMON OIL-1000 PO) Take by mouth.   Probiotic Product (PROBIOTIC-10 PO) Take by mouth.   progesterone (PROMETRIUM) 200 MG capsule Take 1 capsule by mouth daily.   QUERCETIN PO Take by mouth.   Turmeric (QC TUMERIC COMPLEX PO) Take by mouth.   vitamin B-12 (CYANOCOBALAMIN) 250 MCG tablet Take 250 mcg by mouth daily.   vitamin E 180 MG (400 UNITS) capsule Take by mouth.   Zinc Sulfate (ZINC-220 PO) Take by mouth.   [DISCONTINUED] thyroid (ARMOUR) 120 MG tablet TAKE 1 TABLET BY MOUTH DAILY IN THE MORNING   No facility-administered medications prior to visit.   Allergies  Allergen Reactions   Albumin Gc Shortness Of Breath   Sulfamethoxazole-Trimethoprim Shortness Of Breath    diarrhea   Eggs Or Egg-Derived Products Other (See Comments)   Penicillin G Other (See Comments)     States since allergic to eggs can not use this    Immunization History  Administered Date(s) Administered   Pneumococcal Conjugate-13 01/21/2015   Pneumococcal Polysaccharide-23 02/22/2016   Tdap 12/08/2009    Health Maintenance  Topic Date Due   COVID-19 Vaccine (1) Never done   Zoster Vaccines- Shingrix (1 of 2) Never done   INFLUENZA VACCINE  Never done   MAMMOGRAM  09/09/2021   COLONOSCOPY (Pts 45-85yrs Insurance coverage will need to be confirmed)  04/16/2022   TETANUS/TDAP  07/11/2023   Pneumonia Vaccine 75+ Years old  Completed   DEXA SCAN  Completed   Hepatitis C Screening  Completed   HPV VACCINES  Aged Out      Review of Systems  Constitutional:  Positive for fatigue. Negative for chills and fever.  HENT:  Negative for congestion, ear pain and sore throat.   Respiratory:  Negative for cough and shortness of breath.   Cardiovascular:  Negative for chest pain.  Gastrointestinal:  Negative for abdominal pain, constipation, diarrhea, nausea and vomiting.  Genitourinary:  Negative for dysuria and urgency.  Musculoskeletal:  Negative for arthralgias and myalgias.  Skin:  Negative for rash.  Neurological:  Negative for dizziness and headaches.  Psychiatric/Behavioral:  Negative for dysphoric mood. The patient is not nervous/anxious.      Objective    BP 136/76   Pulse 88   Temp (!) 97.3 F (36.3 C)   Ht 5' 1.5" (1.562 m)   Wt 153 lb (69.4 kg)   BMI 28.44 kg/m  Physical Exam Vitals reviewed.  Constitutional:      General: She is not in acute distress.    Appearance: Normal appearance. She is normal weight.  HENT:     Right Ear: Tympanic membrane and ear canal normal.     Left Ear: Tympanic membrane and ear canal normal.     Nose: Nose normal. No congestion or rhinorrhea.  Eyes:     Conjunctiva/sclera: Conjunctivae normal.  Neck:     Thyroid: No thyroid mass.     Vascular: No carotid bruit.  Cardiovascular:     Rate and Rhythm: Normal rate and regular  rhythm.     Pulses: Normal pulses.     Heart sounds: Normal heart sounds. No murmur heard. Pulmonary:     Effort: Pulmonary effort is normal.     Breath sounds: Normal breath sounds.  Abdominal:     General: Bowel sounds are normal.     Palpations: Abdomen  is soft. There is no mass.     Tenderness: There is no abdominal tenderness.  Musculoskeletal:        General: Normal range of motion.  Lymphadenopathy:     Cervical: No cervical adenopathy.  Skin:    General: Skin is warm and dry.  Neurological:     Mental Status: She is alert and oriented to person, place, and time.     Cranial Nerves: No cranial nerve deficit.  Psychiatric:        Mood and Affect: Mood normal.        Behavior: Behavior normal.    Depression Screen PHQ 2/9 Scores 08/14/2018  PHQ - 2 Score 0   No results found for any visits on 06/07/21.  Assessment & Plan      Problem List Items Addressed This Visit       Endocrine   Hypothyroidism (acquired)    Tsh checked today. Came back abnormal.  Recommend decrease armour thyroid to 90 mcg once daily. Repeat tsh in 6 weeks.         Other   Hypertriglyceridemia    Pt thinks she was not fasting when previous lipid was drawn. Rechecked today and lipids are at goal.       Relevant Orders   Lipid panel (Completed)   Other fatigue - Primary    Check labs      Relevant Orders   CBC with Differential/Platelet (Completed)   Comprehensive metabolic panel (Completed)   TSH (Completed)   Body mass index (BMI) of 28.0-28.9 in adult    Recommend continue to work on eating healthy diet and exercise.          Rochel Brome, MD  [facility] [phone] (phone) [phone] (fax)  [place]

## 2021-06-15 DIAGNOSIS — R293 Abnormal posture: Secondary | ICD-10-CM | POA: Diagnosis not present

## 2021-06-15 DIAGNOSIS — M5383 Other specified dorsopathies, cervicothoracic region: Secondary | ICD-10-CM | POA: Diagnosis not present

## 2021-06-15 DIAGNOSIS — M9903 Segmental and somatic dysfunction of lumbar region: Secondary | ICD-10-CM | POA: Diagnosis not present

## 2021-06-15 DIAGNOSIS — M9901 Segmental and somatic dysfunction of cervical region: Secondary | ICD-10-CM | POA: Diagnosis not present

## 2021-06-15 DIAGNOSIS — M47892 Other spondylosis, cervical region: Secondary | ICD-10-CM | POA: Diagnosis not present

## 2021-06-15 DIAGNOSIS — M7912 Myalgia of auxiliary muscles, head and neck: Secondary | ICD-10-CM | POA: Diagnosis not present

## 2021-06-15 DIAGNOSIS — M9902 Segmental and somatic dysfunction of thoracic region: Secondary | ICD-10-CM | POA: Diagnosis not present

## 2021-06-17 ENCOUNTER — Other Ambulatory Visit: Payer: Self-pay

## 2021-06-17 ENCOUNTER — Other Ambulatory Visit: Payer: Medicare HMO

## 2021-06-17 DIAGNOSIS — R5383 Other fatigue: Secondary | ICD-10-CM | POA: Diagnosis not present

## 2021-06-17 DIAGNOSIS — E781 Pure hyperglyceridemia: Secondary | ICD-10-CM

## 2021-06-18 LAB — COMPREHENSIVE METABOLIC PANEL
ALT: 24 IU/L (ref 0–32)
AST: 19 IU/L (ref 0–40)
Albumin/Globulin Ratio: 1.9 (ref 1.2–2.2)
Albumin: 4.2 g/dL (ref 3.7–4.7)
Alkaline Phosphatase: 51 IU/L (ref 44–121)
BUN/Creatinine Ratio: 19 (ref 12–28)
BUN: 12 mg/dL (ref 8–27)
Bilirubin Total: 0.4 mg/dL (ref 0.0–1.2)
CO2: 19 mmol/L — ABNORMAL LOW (ref 20–29)
Calcium: 8.9 mg/dL (ref 8.7–10.3)
Chloride: 103 mmol/L (ref 96–106)
Creatinine, Ser: 0.63 mg/dL (ref 0.57–1.00)
Globulin, Total: 2.2 g/dL (ref 1.5–4.5)
Glucose: 94 mg/dL (ref 70–99)
Potassium: 4.6 mmol/L (ref 3.5–5.2)
Sodium: 138 mmol/L (ref 134–144)
Total Protein: 6.4 g/dL (ref 6.0–8.5)
eGFR: 94 mL/min/{1.73_m2} (ref >59)

## 2021-06-18 LAB — CBC WITH DIFFERENTIAL/PLATELET
Basophils Absolute: 0.1 10*3/uL (ref 0.0–0.2)
Basos: 1 %
EOS (ABSOLUTE): 0.1 10*3/uL (ref 0.0–0.4)
Eos: 1 %
Hematocrit: 38.3 % (ref 34.0–46.6)
Hemoglobin: 12.9 g/dL (ref 11.1–15.9)
Immature Grans (Abs): 0 10*3/uL (ref 0.0–0.1)
Immature Granulocytes: 0 %
Lymphocytes Absolute: 1.5 10*3/uL (ref 0.7–3.1)
Lymphs: 16 %
MCH: 31.9 pg (ref 26.6–33.0)
MCHC: 33.7 g/dL (ref 31.5–35.7)
MCV: 95 fL (ref 79–97)
Monocytes Absolute: 0.9 10*3/uL (ref 0.1–0.9)
Monocytes: 9 %
Neutrophils Absolute: 6.8 10*3/uL (ref 1.4–7.0)
Neutrophils: 73 %
Platelets: 287 10*3/uL (ref 150–450)
RBC: 4.04 x10E6/uL (ref 3.77–5.28)
RDW: 12.7 % (ref 11.7–15.4)
WBC: 9.4 10*3/uL (ref 3.4–10.8)

## 2021-06-18 LAB — LIPID PANEL
Chol/HDL Ratio: 2.4 ratio (ref 0.0–4.4)
Cholesterol, Total: 179 mg/dL (ref 100–199)
HDL: 76 mg/dL (ref >39)
LDL Chol Calc (NIH): 82 mg/dL (ref 0–99)
Triglycerides: 123 mg/dL (ref 0–149)
VLDL Cholesterol Cal: 21 mg/dL (ref 5–40)

## 2021-06-18 LAB — CARDIOVASCULAR RISK ASSESSMENT

## 2021-06-18 LAB — TSH: TSH: 0.416 u[IU]/mL — ABNORMAL LOW (ref 0.450–4.500)

## 2021-06-20 ENCOUNTER — Other Ambulatory Visit: Payer: Self-pay

## 2021-06-20 DIAGNOSIS — E039 Hypothyroidism, unspecified: Secondary | ICD-10-CM

## 2021-06-20 MED ORDER — THYROID 90 MG PO TABS: 90.0000 mg | ORAL_TABLET | Freq: Every day | ORAL | 1 refills | Status: DC

## 2021-06-20 NOTE — Progress Notes (Signed)
Blood count normal.  Liver function normal.  Kidney function normal.  Thyroid function is not therapeutic. Recommend decrease armour thyroid to 90 mcg once daily. Repeat tsh in 6 weeks.  Cholesterol: at goal!

## 2021-06-25 ENCOUNTER — Encounter: Payer: Self-pay | Admitting: Family Medicine

## 2021-06-25 DIAGNOSIS — Z6828 Body mass index (BMI) 28.0-28.9, adult: Secondary | ICD-10-CM | POA: Insufficient documentation

## 2021-06-25 DIAGNOSIS — E663 Overweight: Secondary | ICD-10-CM | POA: Insufficient documentation

## 2021-06-25 NOTE — Assessment & Plan Note (Signed)
Tsh checked today. Came back abnormal.  Recommend decrease armour thyroid to 90 mcg once daily. Repeat tsh in 6 weeks.

## 2021-06-25 NOTE — Assessment & Plan Note (Signed)
Recommend continue to work on eating healthy diet and exercise.  

## 2021-06-25 NOTE — Assessment & Plan Note (Signed)
Check labs 

## 2021-06-25 NOTE — Assessment & Plan Note (Signed)
Pt thinks she was not fasting when previous lipid was drawn. Rechecked today and lipids are at goal.

## 2021-07-26 DIAGNOSIS — R928 Other abnormal and inconclusive findings on diagnostic imaging of breast: Secondary | ICD-10-CM | POA: Diagnosis not present

## 2021-07-26 DIAGNOSIS — N6122 Granulomatous mastitis, left breast: Secondary | ICD-10-CM | POA: Diagnosis not present

## 2021-08-01 ENCOUNTER — Other Ambulatory Visit: Payer: Medicare HMO

## 2021-08-08 ENCOUNTER — Other Ambulatory Visit: Payer: Self-pay

## 2021-08-08 ENCOUNTER — Other Ambulatory Visit: Payer: Medicare HMO

## 2021-08-08 DIAGNOSIS — E039 Hypothyroidism, unspecified: Secondary | ICD-10-CM | POA: Diagnosis not present

## 2021-08-09 DIAGNOSIS — L718 Other rosacea: Secondary | ICD-10-CM | POA: Diagnosis not present

## 2021-08-09 DIAGNOSIS — L570 Actinic keratosis: Secondary | ICD-10-CM | POA: Diagnosis not present

## 2021-08-09 LAB — TSH: TSH: 1.92 u[IU]/mL (ref 0.450–4.500)

## 2021-08-16 ENCOUNTER — Ambulatory Visit: Payer: Medicare HMO | Admitting: Family Medicine

## 2021-08-24 ENCOUNTER — Ambulatory Visit: Payer: Medicare HMO | Admitting: Family Medicine

## 2021-08-25 ENCOUNTER — Encounter: Payer: Self-pay | Admitting: Legal Medicine

## 2021-08-25 ENCOUNTER — Ambulatory Visit (INDEPENDENT_AMBULATORY_CARE_PROVIDER_SITE_OTHER): Payer: Medicare HMO | Admitting: Legal Medicine

## 2021-08-25 ENCOUNTER — Other Ambulatory Visit: Payer: Self-pay

## 2021-08-25 VITALS — BP 134/80 | HR 86 | Temp 98.0°F | Resp 15 | Ht 62.0 in | Wt 151.0 lb

## 2021-08-25 DIAGNOSIS — Z0000 Encounter for general adult medical examination without abnormal findings: Secondary | ICD-10-CM | POA: Insufficient documentation

## 2021-08-25 NOTE — Progress Notes (Signed)
Subjective:   Maureen Obrien is a 76 y.o. female who presents for an subsequent Medicare Annual Wellness Visit.  Review of Systems    Review of Systems  Constitutional:  Negative for chills and fever.  HENT:  Negative for hearing loss (hearing aids).        Glasses  Eyes:  Negative for blurred vision.       Cataract surgery one year ago  Respiratory:  Negative for cough.   Cardiovascular:  Negative for chest pain and palpitations.  Gastrointestinal: Negative.  Negative for constipation and diarrhea.  Genitourinary:  Negative for dysuria, frequency and urgency.  Musculoskeletal:  Negative for myalgias and neck pain.  Skin:  Negative for rash.  Neurological: Negative.   Psychiatric/Behavioral: Negative.           Objective:    Today's Vitals   08/25/21 1025  BP: 134/80  Pulse: 86  Resp: 15  Temp: 98 F (36.7 C)  Weight: 151 lb (68.5 kg)  Height: 5\' 2"  (1.575 m)   Body mass index is 27.62 kg/m.  Advanced Directives 08/25/2021  Does Patient Have a Medical Advance Directive? Yes  Type of Paramedic of [place];Living will  Copy of [place] in Chart? No - copy requested    Current Medications (verified) Outpatient Encounter Medications as of 08/25/2021  Medication Sig   Amino Acids (AMINO ACID PO) Take by mouth.   Ascorbic Acid (VITAMIN C) 1000 MG tablet Take 1,000 mg by mouth daily.   CHELATED MAGNESIUM PO Take by mouth.   Cholecalciferol (VITAMIN D) 125 MCG (5000 UT) CAPS Take by mouth daily.   co-enzyme Q-10 30 MG capsule Take 30 mg by mouth 3 (three) times daily.   ELDERBERRY PO Take by mouth.   estradiol (ESTRACE) 2 MG tablet Take 2 mg by mouth.   glucosamine-chondroitin 500-400 MG tablet Take 1 tablet by mouth 3 (three) times daily.   Homeopathic Products (LIVER SUPPORT SL) Place under the tongue.   metroNIDAZOLE (METROCREAM) 0.75 % cream Use as directed twice daily   Omega-3 Fatty Acids (SALMON OIL-1000 PO)  Take by mouth.   Probiotic Product (PROBIOTIC-10 PO) Take by mouth.   progesterone (PROMETRIUM) 200 MG capsule Take 1 capsule by mouth daily.   QUERCETIN PO Take by mouth.   Testosterone 20 % CREA 1 applicator daily.   thyroid (ARMOUR THYROID) 90 MG tablet Take 1 tablet (90 mg total) by mouth daily.   Turmeric (QC TUMERIC COMPLEX PO) Take by mouth.   vitamin B-12 (CYANOCOBALAMIN) 250 MCG tablet Take 250 mcg by mouth daily.   vitamin E 180 MG (400 UNITS) capsule Take by mouth.   Zinc Sulfate (ZINC-220 PO) Take by mouth.   No facility-administered encounter medications on file as of 08/25/2021.    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   Hypothyroidism    Hypothyroidism    Urticaria    Vitiligo    Past Surgical History:  Procedure Laterality Date   BREAST CYST ASPIRATION Left 35 yrs ago   [place]     childhood   Family History  Problem Relation Age of Onset   Pancreatic cancer Mother    Aneurysm Father    Breast cancer Sister 61   Breast  cancer Sister 50   Skin cancer Brother    Heart attack Brother    Hepatitis C Brother    Skin cancer Brother    Social History   Socioeconomic History   Marital status: Married    Spouse name: Not on file   Number of children: 2   Years of education: Not on file   Highest education level: Master's degree (e.g., MA, MS, MEng, MEd, MSW, MBA)  Occupational History   Not on file  Tobacco Use   Smoking status: Never   Smokeless tobacco: Never  Substance and Sexual Activity   Alcohol use: Yes    Alcohol/week: 14.0 standard drinks    Types: 14 Glasses of wine per week   Drug use: Never   Sexual activity: Yes  Other Topics Concern   Not on file  Social History Narrative   Not on file   Social  Determinants of Health   Financial Resource Strain: Low Risk    Difficulty of Paying Living Expenses: Not hard at all  Food Insecurity: No Food Insecurity   Worried About Charity fundraiser in the Last Year: Never true   [place] in the Last Year: Never true  Transportation Needs: No Transportation Needs   Lack of Transportation (Medical): No   Lack of Transportation (Non-Medical): No  Physical Activity: Insufficiently Active   Days of Exercise per Week: 3 days   Minutes of Exercise per Session: 40 min  Stress: No Stress Concern Present   Feeling of Stress : Not at all  Social Connections: Unknown   Frequency of Communication with Friends and Family: More than three times a week   Frequency of Social Gatherings with Friends and Family: More than three times a week   Attends Religious Services: Not on Electrical engineer or Organizations: No   Attends Archivist Meetings: Never   Marital Status: Married    Tobacco Counseling Counseling given: Not Answered   Clinical Intake:  Pre-visit preparation completed: Yes  Pain : No/denies pain     BMI - recorded: 27.62 Nutritional Status: BMI of 19-24  Normal Nutritional Risks: None Diabetes: No  How often do you need to have someone help you when you read instructions, pamphlets, or other written materials from your doctor or pharmacy?: 1 - Never  Diabetic?no  Interpreter Needed?: No      Activities of Daily Living In your present state of health, do you have any difficulty performing the following activities: 08/23/2021 08/21/2021  Hearing? N N  Vision? N N  Difficulty concentrating or making decisions? N N  Walking or climbing stairs? N N  Dressing or bathing? N N  Doing errands, shopping? N N  Preparing Food and eating ? N N  Using the Toilet? N N  In the past six months, have you accidently leaked urine? N N  Do you have problems with loss of bowel control? N N  Managing your  Medications? N N  Managing your Finances? N N  Housekeeping or managing your Housekeeping? N N  Some recent data might be hidden    Patient Care Team: CoxElnita Maxwell, MD as PCP - General (Internal Medicine)  Indicate any recent Medical Services you may have received from other than [facility] providers in the past year (date may be approximate).     Assessment:   This is a routine wellness examination for [place].  Hearing/Vision screen No results found.  Dietary issues  and exercise activities discussed:     Goals Addressed   None   Depression Screen PHQ 2/9 Scores 08/25/2021 08/14/2018  PHQ - 2 Score 0 0    Fall Risk Fall Risk  08/25/2021 08/23/2021 08/21/2021 06/25/2021 08/14/2018  Falls in the past year? 0 0 0 0 0  Number falls in past yr: 0 0 0 0 -  Injury with Fall? 0 0 0 0 -  Risk for fall due to : No Fall Risks - - No Fall Risks -  Follow up - - - Falls evaluation completed -    FALL RISK PREVENTION PERTAINING TO THE HOME:  Any stairs in or around the home? Yes  If so, are there any without handrails? Yes  Home free of loose throw rugs in walkways, pet beds, electrical cords, etc? No  Adequate lighting in your home to reduce risk of falls? Yes   ASSISTIVE DEVICES UTILIZED TO PREVENT FALLS:  Life alert? No  Use of a cane, walker or w/c? No  Grab bars in the bathroom? Yes  Shower chair or bench in shower? No  Elevated toilet seat or a handicapped toilet? Yes   TIMED UP AND GO:  Was the test performed? No .  Length of time to ambulate 10 feet: 2 sec.   Normal gait.  Cognitive Function: normal     6CIT Screen 08/25/2021  What Year? 0 points  What month? 0 points  What time? 0 points  Count back from 20 0 points  Months in reverse 0 points  Repeat phrase 0 points  Total Score 0    Immunizations Immunization History  Administered Date(s) Administered   Pneumococcal Conjugate-13 01/21/2015   Pneumococcal Polysaccharide-23 02/22/2016   Tdap 12/08/2009     TDAP status: Up to date  Flu Vaccine status: Up to date  Pneumococcal vaccine status: Up to date  Covid-19 vaccine status: Completed vaccines  Qualifies for Shingles Vaccine? No   Zostavax completed No   Shingrix Completed?: No.    Education has been provided regarding the importance of this vaccine. Patient has been advised to call insurance company to determine out of pocket expense if they have not yet received this vaccine. Advised may also receive vaccine at local pharmacy or Health Dept. Verbalized acceptance and understanding.  Screening Tests Health Maintenance  Topic Date Due   Zoster Vaccines- Shingrix (1 of 2) Never done   COVID-19 Vaccine (1) 09/11/2023 (Originally 05/26/1946)   MAMMOGRAM  09/09/2021   COLONOSCOPY (Pts 45-40yrs Insurance coverage will need to be confirmed)  04/16/2022   TETANUS/TDAP  07/11/2023   Pneumonia Vaccine 52+ Years old  Completed   DEXA SCAN  Completed   Hepatitis C Screening  Completed   HPV VACCINES  Aged Out   INFLUENZA VACCINE  Discontinued    Health Maintenance  Health Maintenance Due  Topic Date Due   Zoster Vaccines- Shingrix (1 of 2) Never done    Colorectal cancer screening: Type of screening: Colonoscopy. Completed 2018. Repeat every 5 years  Mammogram status: Completed 2021. Repeat every year    Lung Cancer Screening: (Low Dose CT Chest recommended if Age 39-80 years, 30 pack-year currently smoking OR have quit w/in 15years.) does not qualify.   Lung Cancer Screening Referral: na  Additional Screening:  Hepatitis C Screening: does not qualify; Completed 2020  Vision Screening: Recommended annual ophthalmology exams for early detection of glaucoma and other disorders of the eye. Is the patient up to date with their annual eye  exam?  Yes  Who is the provider or what is the name of the office in which the patient attends annual eye exams? 2023 If pt is not established with a provider, would they like to be referred  to a provider to establish care? No .   Dental Screening: Recommended annual dental exams for proper oral hygiene  Community Resource Referral / Chronic Care Management: CRR required this visit?  No   CCM required this visit?  No      Plan:     I have personally reviewed and noted the following in the patients chart:   Medical and social history Use of alcohol, tobacco or illicit drugs  Current medications and supplements including opioid prescriptions. Patient is not currently taking opioid prescriptions. Functional ability and status Nutritional status Physical activity Advanced directives List of other physicians Hospitalizations, surgeries, and ER visits in previous 12 months Vitals Screenings to include cognitive, depression, and falls Referrals and appointments  In addition, I have reviewed and discussed with patient certain preventive protocols, quality metrics, and best practice recommendations. A written personalized care plan for preventive services as well as general preventive health recommendations were provided to patient.     Reinaldo Meeker, MD   08/25/2021   Nurse Notes: Lemmie Evens

## 2021-08-25 NOTE — Patient Instructions (Signed)

## 2021-09-15 DIAGNOSIS — H04123 Dry eye syndrome of bilateral lacrimal glands: Secondary | ICD-10-CM | POA: Diagnosis not present

## 2021-09-15 DIAGNOSIS — H43813 Vitreous degeneration, bilateral: Secondary | ICD-10-CM | POA: Diagnosis not present

## 2021-12-11 ENCOUNTER — Other Ambulatory Visit: Payer: Self-pay | Admitting: Family Medicine

## 2022-01-06 DIAGNOSIS — D599 Acquired hemolytic anemia, unspecified: Secondary | ICD-10-CM | POA: Diagnosis not present

## 2022-01-06 DIAGNOSIS — R5383 Other fatigue: Secondary | ICD-10-CM | POA: Diagnosis not present

## 2022-01-06 DIAGNOSIS — E039 Hypothyroidism, unspecified: Secondary | ICD-10-CM | POA: Diagnosis not present

## 2022-01-06 DIAGNOSIS — E2740 Unspecified adrenocortical insufficiency: Secondary | ICD-10-CM | POA: Diagnosis not present

## 2022-01-06 DIAGNOSIS — E349 Endocrine disorder, unspecified: Secondary | ICD-10-CM | POA: Diagnosis not present

## 2022-01-06 DIAGNOSIS — N951 Menopausal and female climacteric states: Secondary | ICD-10-CM | POA: Diagnosis not present

## 2022-01-06 DIAGNOSIS — E559 Vitamin D deficiency, unspecified: Secondary | ICD-10-CM | POA: Diagnosis not present

## 2022-01-24 DIAGNOSIS — N6122 Granulomatous mastitis, left breast: Secondary | ICD-10-CM | POA: Diagnosis not present

## 2022-01-24 DIAGNOSIS — R922 Inconclusive mammogram: Secondary | ICD-10-CM | POA: Diagnosis not present

## 2022-08-10 DIAGNOSIS — L718 Other rosacea: Secondary | ICD-10-CM | POA: Diagnosis not present

## 2022-08-10 DIAGNOSIS — L570 Actinic keratosis: Secondary | ICD-10-CM | POA: Diagnosis not present

## 2022-08-28 ENCOUNTER — Encounter: Payer: Self-pay | Admitting: Family Medicine

## 2022-08-28 ENCOUNTER — Ambulatory Visit (INDEPENDENT_AMBULATORY_CARE_PROVIDER_SITE_OTHER): Payer: Medicare HMO | Admitting: Family Medicine

## 2022-08-28 VITALS — BP 132/70 | HR 89 | Temp 97.2°F | Ht 62.0 in | Wt 149.0 lb

## 2022-08-28 DIAGNOSIS — Z780 Asymptomatic menopausal state: Secondary | ICD-10-CM | POA: Diagnosis not present

## 2022-08-28 DIAGNOSIS — E781 Pure hyperglyceridemia: Secondary | ICD-10-CM | POA: Diagnosis not present

## 2022-08-28 DIAGNOSIS — Z6827 Body mass index (BMI) 27.0-27.9, adult: Secondary | ICD-10-CM

## 2022-08-28 DIAGNOSIS — Z0000 Encounter for general adult medical examination without abnormal findings: Secondary | ICD-10-CM

## 2022-08-28 DIAGNOSIS — E039 Hypothyroidism, unspecified: Secondary | ICD-10-CM | POA: Diagnosis not present

## 2022-08-28 DIAGNOSIS — E663 Overweight: Secondary | ICD-10-CM

## 2022-08-28 NOTE — Assessment & Plan Note (Signed)
Healthy 77 yo female. Education given.  Recommend increase walking.

## 2022-08-28 NOTE — Assessment & Plan Note (Signed)
Normal last year.  Patient have checked at specialist in June.

## 2022-08-28 NOTE — Assessment & Plan Note (Signed)
Recommend continue to work on eating healthy diet and exercise.  

## 2022-08-28 NOTE — Patient Instructions (Signed)
  Ms. Maureen Obrien , Thank you for taking time to come for your Medicare Wellness Visit. I appreciate your ongoing commitment to your health goals. Please review the following plan we discussed and let me know if I can assist you in the future.    This is a list of the screening recommended for you and due dates:  Health Maintenance  Topic Date Due   Zoster (Shingles) Vaccine (1 of 2) Never done   DTaP/Tdap/Td vaccine (2 - Td or Tdap) 12/09/2019   Mammogram  09/09/2021   Colon Cancer Screening  04/16/2022   COVID-19 Vaccine (1) 09/11/2023*   Medicare Annual Wellness Visit  08/29/2023   Pneumonia Vaccine  Completed   DEXA scan (bone density measurement)  Completed   Hepatitis C Screening: USPSTF Recommendation to screen - Ages 33-79 yo.  Completed   HPV Vaccine  Aged Out   Flu Shot  Discontinued  *Topic was postponed. The date shown is not the original due date.

## 2022-08-28 NOTE — Assessment & Plan Note (Signed)
Management by specialist.

## 2022-08-28 NOTE — Assessment & Plan Note (Signed)
Management by specialist.  Ordered dexa.

## 2022-08-28 NOTE — Progress Notes (Signed)
Subjective:   Maureen Obrien is a 77 y.o. female who presents for Medicare Annual (Subsequent) preventive examination.  Patient sees specialist for hormone management and hypothyroidism annually in June. She gets labs done there. Denies any issues.    Review of Systems    Review of Systems  Constitutional:  Negative for chills, fever and malaise/fatigue.  HENT:  Negative for ear pain, sinus pain and sore throat.   Respiratory:  Negative for cough and shortness of breath.   Cardiovascular:  Negative for chest pain.  Musculoskeletal:  Negative for myalgias.  Neurological:  Negative for headaches.  Psychiatric/Behavioral:  Negative for depression.     Cardiac Risk Factors include: advanced age (>51mn, >>65women)     Objective:    Today's Vitals   08/28/22 1010  BP: 132/70  Pulse: 89  Temp: (!) 97.2 F (36.2 C)  SpO2: 98%  Weight: 149 lb (67.6 kg)  Height: 5' 2"$  (1.575 m)   Body mass index is 27.25 kg/m.  Physical Exam Vitals reviewed.  Constitutional:      Appearance: Normal appearance. She is normal weight.  Neck:     Vascular: No carotid bruit.  Cardiovascular:     Rate and Rhythm: Normal rate and regular rhythm.     Heart sounds: Normal heart sounds.  Pulmonary:     Effort: Pulmonary effort is normal. No respiratory distress.     Breath sounds: Normal breath sounds.  Abdominal:     General: Abdomen is flat. Bowel sounds are normal.     Palpations: Abdomen is soft.     Tenderness: There is no abdominal tenderness.  Neurological:     Mental Status: She is alert and oriented to person, place, and time.  Psychiatric:        Mood and Affect: Mood normal.        Behavior: Behavior normal.        08/28/2022   10:12 AM 08/25/2021   10:28 AM  Advanced Directives  Does Patient Have a Medical Advance Directive? Yes Yes  Type of AP[place]amed[place]of [place] will [place]ort [place]Living will  Does patient want to make  changes to medical advance directive? Yes (ED - Information included in AVS)   Copy of HVistain Chart?  No - copy requested    Current Medications (verified) Outpatient Encounter Medications as of 08/28/2022  Medication Sig   Amino Acids (AMINO ACID PO) Take by mouth.   ARMOUR THYROID 90 MG tablet TAKE 1 TABLET BY MOUTH EVERY DAY   Ascorbic Acid (VITAMIN C) 1000 MG tablet Take 1,000 mg by mouth daily.   CHELATED MAGNESIUM PO Take by mouth.   Cholecalciferol (VITAMIN D) 125 MCG (5000 UT) CAPS Take by mouth daily.   co-enzyme Q-10 30 MG capsule Take 30 mg by mouth 3 (three) times daily.   estradiol (ESTRACE) 2 MG tablet Take 2 mg by mouth.   glucosamine-chondroitin 500-400 MG tablet Take 1 tablet by mouth 3 (three) times daily.   Homeopathic Products (LIVER SUPPORT SL) Place under the tongue.   metroNIDAZOLE (METROCREAM) 0.75 % cream Use as directed twice daily   Omega-3 Fatty Acids (SALMON OIL-1000 PO) Take by mouth.   Probiotic Product (PROBIOTIC-10 PO) Take by mouth.   progesterone (PROMETRIUM) 200 MG capsule Take 1 capsule by mouth daily.   Testosterone 20 % CREA 1 applicator daily.   Turmeric (QC TUMERIC COMPLEX PO) Take by mouth.   vitamin B-12 (CYANOCOBALAMIN)  250 MCG tablet Take 250 mcg by mouth daily.   vitamin E 180 MG (400 UNITS) capsule Take by mouth.   Zinc Sulfate (ZINC-220 PO) Take by mouth.   [DISCONTINUED] ELDERBERRY PO Take by mouth.   [DISCONTINUED] QUERCETIN PO Take by mouth.   No facility-administered encounter medications on file as of 08/28/2022.    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   Hypothyroidism    Hypothyroidism    Urticaria    Vitiligo    Past Surgical History:  Procedure Laterality Date   BREAST CYST ASPIRATION Left 35 yrs ago   [place]     childhood   Family History  Problem Relation Age of Onset   Pancreatic cancer Mother    Aneurysm Father    Breast cancer Sister 76   Breast cancer Sister 15   Skin cancer Brother    Heart attack Brother    Hepatitis C Brother    Skin cancer Brother    Social History   Socioeconomic History   Marital status: Married    Spouse name: Not on file   Number of children: 2   Years of education: Not on file   Highest education level: Master's degree (e.g., MA, MS, MEng, MEd, MSW, MBA)  Occupational History   Not on file  Tobacco Use   Smoking status: Never   Smokeless tobacco: Never  Substance and Sexual Activity   Alcohol use: Yes    Alcohol/week: 14.0 standard drinks of alcohol    Types: 14 Glasses of wine per week   Drug use: Never   Sexual activity: Yes  Other Topics Concern   Not on file  Social History Narrative   Not on file   Social Determinants of Health   Financial Resource Strain: Low Risk  (08/24/2022)   Overall Financial Resource Strain (CARDIA)    Difficulty of Paying Living Expenses: Not hard at all  Food Insecurity: No Food Insecurity (08/24/2022)   Hunger Vital Sign    Worried About Running Out of Food in the Last Year: Never true    Ran Out of Food in the Last Year: Never true  Transportation Needs: No Transportation Needs (08/24/2022)   PRAPARE - Hydrologist (Medical): No    Lack of Transportation (Non-Medical): No  Physical Activity: Insufficiently Active (08/24/2022)   Exercise Vital Sign    Days of Exercise per Week: 3 days    Minutes of Exercise per Session: 40 min  Stress: No Stress Concern Present (08/24/2022)   [place]    Feeling of Stress : Not at all  Social Connections: Moderately Isolated (08/28/2022)   Social Connection and Isolation Panel [NHANES]    Frequency of Communication with Friends and Family: More than three times a  week    Frequency of Social Gatherings with Friends and Family: More than three times a week    Attends Religious Services: Never    Marine scientist or Organizations: No    Attends Music therapist: Never    Marital Status: Married    Tobacco Counseling Counseling given: Not Answered   Clinical Intake:  Pain : No/denies pain   Nutritional Status: BMI 25 -29 Overweight Nutritional Risks: None  Diabetes: No  How often do you need to have someone help you when you read instructions, pamphlets, or other written materials from your doctor or pharmacy?: 1 - Never  [place] of Daily Living    08/24/2022   11:10 AM  In your present state of health, do you have any difficulty performing the following activities:  Hearing? 0  Vision? 0  Difficulty concentrating or making decisions? 0  Walking or climbing stairs? 0  Dressing or bathing? 0  Doing errands, shopping? 0  Preparing Food and eating ? N  Using the Toilet? N  In the past six months, have you accidently leaked urine? N  Do you have problems with loss of bowel control? N  Managing your Medications? N  Managing your Finances? N  Housekeeping or managing your Housekeeping? N    Patient Care Team: CoxElnita Maxwell, MD as PCP - General (Internal Medicine)  Indicate any recent Medical Services you may have received from other than [facility] providers in the past year (date may be approximate).     Assessment:   This is a routine wellness examination for [place].  Hearing/Vision screen No results found.  Dietary issues and exercise activities discussed: Current Exercise Habits: Home exercise routine, Type of exercise: walking;strength training/weights, Time (Minutes): 60, Frequency (Times/Week): 7, Weekly Exercise (Minutes/Week): 420, Intensity: Moderate   Goals Addressed             This Visit's Progress    Activity and Exercise Increased       Evidence-based guidance:  Review  current exercise levels.  Assess patient perspective on exercise or activity level, barriers to increasing activity, motivation and readiness for change.  Recommend or set healthy exercise goal based on individual tolerance.  Encourage small steps toward making change in amount of exercise or activity.  Urge reduction of sedentary activities or screen time.  Promote group activities within the community or with family or support person.  Consider referral to rehabiliation therapist for assessment and exercise/activity plan.   Notes:       Depression Screen    08/28/2022   10:12 AM 08/25/2021   10:29 AM 08/14/2018   10:36 AM  PHQ 2/9 Scores  PHQ - 2 Score 0 0 0    Fall Risk    08/24/2022   11:10 AM 08/25/2021   10:28 AM 08/23/2021    3:03 PM 08/21/2021    2:54 PM 06/25/2021   10:57 AM  Fall Risk   Falls in the past year? 0 0 0 0 0  Number falls in past yr: 0 0 0 0 0  Injury with Fall? 0 0 0 0 0  Risk for fall due to : No Fall Risks No Fall Risks   No Fall Risks  Follow up Falls evaluation completed    Falls evaluation completed    FALL RISK PREVENTION PERTAINING TO THE HOME:  Any stairs in or around the home? Yes  If so, are there any without handrails? Yes  Home free of loose throw rugs in walkways, pet beds, electrical cords, etc? No  Adequate lighting in your home to reduce risk of falls? Yes   ASSISTIVE DEVICES UTILIZED TO PREVENT FALLS:  Life alert? No  Use of a cane, walker or w/c? No  Grab bars in the bathroom? Yes  Shower chair or bench in shower? Yes  Elevated toilet seat or a handicapped toilet? Yes   TIMED UP AND GO:  Was the test performed? No .  Length of time to ambulate 10 feet:   Gait steady and fast without use of assistive device  Cognitive Function:        08/25/2021   10:29 AM  6CIT Screen  What Year? 0 points  What month? 0 points  What time? 0 points  Count back from 20 0 points  Months in reverse 0 points  Repeat phrase 0 points   Total Score 0 points    Immunizations Immunization History  Administered Date(s) Administered   Pneumococcal Conjugate-13 01/21/2015   Pneumococcal Polysaccharide-23 02/22/2016   Tdap 12/08/2009    TDAP status: Due, Education has been provided regarding the importance of this vaccine. Advised may receive this vaccine at local pharmacy or Health Dept. Aware to provide a copy of the vaccination record if obtained from local pharmacy or Health Dept. Verbalized acceptance and understanding.  Flu Vaccine status: Declined, Education has been provided regarding the importance of this vaccine but patient still declined. Advised may receive this vaccine at local pharmacy or Health Dept. Aware to provide a copy of the vaccination record if obtained from local pharmacy or Health Dept. Verbalized acceptance and understanding.  Pneumococcal vaccine status: Up to date  Covid-19 vaccine status: Completed vaccines  Qualifies for Shingles Vaccine? Yes   Zostavax completed No   Shingrix Completed?: No.    Education has been provided regarding the importance of this vaccine. Patient has been advised to call insurance company to determine out of pocket expense if they have not yet received this vaccine. Advised may also receive vaccine at local pharmacy or Health Dept. Verbalized acceptance and understanding.  Screening Tests Health Maintenance  Topic Date Due   Zoster Vaccines- Shingrix (1 of 2) Never done   DTaP/Tdap/Td (2 - Td or Tdap) 12/09/2019   MAMMOGRAM  09/09/2021   COLONOSCOPY (Pts 45-61yr Insurance coverage will need to be confirmed)  04/16/2022   COVID-19 Vaccine (1) 09/11/2023 (Originally 11/23/1950)   Medicare Annual Wellness (AWV)  08/29/2023   Pneumonia Vaccine 75 Years old  Completed   DEXA SCAN  Completed   Hepatitis C Screening  Completed   HPV VACCINES  Aged Out   IT[place]Maintenance  Health Maintenance Due  Topic Date Due   Zoster  Vaccines- Shingrix (1 of 2) Never done   DTaP/Tdap/Td (2 - Td or Tdap) 12/09/2019   MAMMOGRAM  09/09/2021   COLONOSCOPY (Pts 45-442yrInsurance coverage will need to be confirmed)  04/16/2022    Colorectal cancer screening: Has up coming appt.  Mammogram status: Has scheduled with [facility]  Bone Density status: Ordered today. Pt provided with contact info and advised to call to schedule appt.  Lung Cancer Screening: (Low Dose CT Chest recommended if Age 75-80ears, 30 pack-year currently smoking OR have quit w/in 15years.) does not qualify.   Lung Cancer Screening Referral:   Additional Screening:  Hepatitis C Screening: does not qualify;   Vision Screening: Recommended annual ophthalmology exams for early detection of glaucoma and other disorders of the eye. Is the patient up to date with their annual eye exam?  Yes  Who is the provider or what is the name of the office in which the patient attends annual eye exams?  If pt is not established with a provider, would they like to be referred to a provider to establish care? No .   Dental Screening: Recommended annual dental exams for proper oral hygiene  Community Resource Referral / Chronic Care Management:  CRR required this visit?  No   CCM required this visit?  No      Plan:     I have personally reviewed and noted the following in the patient's chart:   Medical and social history Use of alcohol, tobacco or illicit drugs  Current medications and supplements including opioid prescriptions. Patient is not currently taking opioid prescriptions. Functional ability and status Nutritional status Physical activity Advanced directives List of other physicians Hospitalizations, surgeries, and ER visits in previous 12 months Vitals Screenings to include cognitive, depression, and falls Referrals and appointments  In addition, I have reviewed and discussed with patient certain preventive protocols, quality metrics, and best  practice recommendations. A written personalized care plan for preventive services as well as general preventive health recommendations were provided to patient.

## 2022-10-20 ENCOUNTER — Telehealth: Payer: Self-pay

## 2022-10-20 NOTE — Telephone Encounter (Signed)
Bone density appointment on 11/02/2022 arrived at 11:00 am for appointment time at 11:30 am. At Outpatient center at [facility]. I left detailed message on voicemail.

## 2022-10-30 ENCOUNTER — Telehealth: Payer: Self-pay

## 2022-10-30 DIAGNOSIS — W57XXXA Bitten or stung by nonvenomous insect and other nonvenomous arthropods, initial encounter: Secondary | ICD-10-CM | POA: Diagnosis not present

## 2022-10-30 DIAGNOSIS — S80861A Insect bite (nonvenomous), right lower leg, initial encounter: Secondary | ICD-10-CM | POA: Diagnosis not present

## 2022-10-30 NOTE — Telephone Encounter (Signed)
Pt called today to request a same day appointment for the following symptoms:tick bite-red/swollen- she found it on Friday. red swollen/hard spot behind knee/painful . Unfortunately our schedule is full and we have no openings. Pt was notified to go to Urgent Care.-note I spoke with [place] about this patient

## 2022-11-30 DIAGNOSIS — L718 Other rosacea: Secondary | ICD-10-CM | POA: Diagnosis not present

## 2022-11-30 DIAGNOSIS — C44319 Basal cell carcinoma of skin of other parts of face: Secondary | ICD-10-CM | POA: Diagnosis not present

## 2022-11-30 DIAGNOSIS — D485 Neoplasm of uncertain behavior of skin: Secondary | ICD-10-CM | POA: Diagnosis not present

## 2022-12-13 DIAGNOSIS — N959 Unspecified menopausal and perimenopausal disorder: Secondary | ICD-10-CM | POA: Diagnosis not present

## 2022-12-13 DIAGNOSIS — M85852 Other specified disorders of bone density and structure, left thigh: Secondary | ICD-10-CM | POA: Diagnosis not present

## 2022-12-13 LAB — HM DEXA SCAN: HM Dexa Scan: UNDETERMINED

## 2022-12-14 ENCOUNTER — Encounter: Payer: Self-pay | Admitting: Family Medicine

## 2023-01-17 DIAGNOSIS — C44319 Basal cell carcinoma of skin of other parts of face: Secondary | ICD-10-CM | POA: Diagnosis not present

## 2023-01-24 DIAGNOSIS — L905 Scar conditions and fibrosis of skin: Secondary | ICD-10-CM | POA: Diagnosis not present

## 2023-01-25 DIAGNOSIS — R928 Other abnormal and inconclusive findings on diagnostic imaging of breast: Secondary | ICD-10-CM | POA: Diagnosis not present

## 2023-01-25 DIAGNOSIS — N6122 Granulomatous mastitis, left breast: Secondary | ICD-10-CM | POA: Diagnosis not present

## 2023-08-14 DIAGNOSIS — L718 Other rosacea: Secondary | ICD-10-CM | POA: Diagnosis not present

## 2023-08-14 DIAGNOSIS — L821 Other seborrheic keratosis: Secondary | ICD-10-CM | POA: Diagnosis not present

## 2023-08-14 DIAGNOSIS — L814 Other melanin hyperpigmentation: Secondary | ICD-10-CM | POA: Diagnosis not present

## 2023-08-14 DIAGNOSIS — Z85828 Personal history of other malignant neoplasm of skin: Secondary | ICD-10-CM | POA: Diagnosis not present

## 2023-08-14 DIAGNOSIS — D1801 Hemangioma of skin and subcutaneous tissue: Secondary | ICD-10-CM | POA: Diagnosis not present

## 2023-08-14 DIAGNOSIS — L578 Other skin changes due to chronic exposure to nonionizing radiation: Secondary | ICD-10-CM | POA: Diagnosis not present

## 2023-08-15 DIAGNOSIS — R5383 Other fatigue: Secondary | ICD-10-CM | POA: Diagnosis not present

## 2023-08-15 DIAGNOSIS — N950 Postmenopausal bleeding: Secondary | ICD-10-CM | POA: Diagnosis not present

## 2023-08-15 DIAGNOSIS — E349 Endocrine disorder, unspecified: Secondary | ICD-10-CM | POA: Diagnosis not present

## 2023-08-15 DIAGNOSIS — E039 Hypothyroidism, unspecified: Secondary | ICD-10-CM | POA: Diagnosis not present

## 2023-09-04 ENCOUNTER — Encounter: Payer: Self-pay | Admitting: Family Medicine

## 2023-09-04 ENCOUNTER — Ambulatory Visit (INDEPENDENT_AMBULATORY_CARE_PROVIDER_SITE_OTHER): Payer: Medicare HMO | Admitting: Family Medicine

## 2023-09-04 VITALS — BP 136/80 | HR 88 | Temp 97.6°F | Ht 62.0 in | Wt 144.0 lb

## 2023-09-04 DIAGNOSIS — Z0000 Encounter for general adult medical examination without abnormal findings: Secondary | ICD-10-CM | POA: Diagnosis not present

## 2023-09-04 DIAGNOSIS — Z1211 Encounter for screening for malignant neoplasm of colon: Secondary | ICD-10-CM

## 2023-09-04 DIAGNOSIS — E039 Hypothyroidism, unspecified: Secondary | ICD-10-CM | POA: Diagnosis not present

## 2023-09-04 NOTE — Assessment & Plan Note (Addendum)
 Education given.  Recommend rsv vaccine, tdap, and shingrix vaccine series.

## 2023-09-04 NOTE — Patient Instructions (Signed)

## 2023-09-04 NOTE — Progress Notes (Signed)
 Subjective:   Maureen Obrien is a 78 y.o. female who presents for Medicare Annual (Subsequent) preventive examination.  Visit Complete: In person  Patient Medicare AWV questionnaire was completed by the patient; I have confirmed that all information answered by patient is correct and no changes since this date.  Patient has hypothyroidism.  She sees endocrinology.  She recently had blood work which I am unable to access.  Patient wears her seatbelt.  She has functional smoke detectors and carbon monoxide detectors in her home.  They have no guns in her home.  She has vitiligo and so she always wears sunscreen or covers for skin.  She has a history of basal cell carcinoma sees a dermatologist annually over at [facility].  She also has history of abnormal mammograms and was being followed at [facility].  She was released as a normalized however I suggested she continue to get her mammograms there so they have the previous ones to compare to.  Patient does have bunions although they do not cause her pain and.  She wears a wide shoes.  She still working as an Tree surgeon.  She has 3 upcoming shows and is very happy.  Patient does not wish to have the shingles vaccines.  She is due for tetanus vaccine.  Cardiac Risk Factors include: advanced age (>70men, >77 women)  Review of Systems  Constitutional:  Negative for chills, diaphoresis, fever and malaise/fatigue.  HENT:  Negative for congestion, ear pain and sore throat.   Respiratory:  Negative for cough.   Cardiovascular:  Negative for chest pain.  Gastrointestinal:  Negative for abdominal pain, constipation, diarrhea, nausea and vomiting.  Genitourinary:  Negative for dysuria and urgency.  Musculoskeletal:  Negative for joint pain and myalgias.  Psychiatric/Behavioral:  Negative for depression. The patient is not nervous/anxious.        Objective:    Today's Vitals   09/04/23 1010  BP: 136/80  Pulse: 88  Temp: 97.6 F (36.4 C)  SpO2: 96%   Weight: 144 lb (65.3 kg)  Height: 5\' 2"  (1.575 m)   Body mass index is 26.34 kg/m.  Physical Exam Vitals reviewed.  Constitutional:      Appearance: Normal appearance. She is normal weight.  Neck:     Vascular: No carotid bruit.  Cardiovascular:     Rate and Rhythm: Normal rate and regular rhythm.     Heart sounds: Normal heart sounds.  Pulmonary:     Effort: Pulmonary effort is normal. No respiratory distress.     Breath sounds: Normal breath sounds.  Abdominal:     General: Abdomen is flat. Bowel sounds are normal.     Palpations: Abdomen is soft.     Tenderness: There is no abdominal tenderness.  Musculoskeletal:     Comments: Bunions right > left.   Neurological:     Mental Status: She is alert and oriented to person, place, and time.  Psychiatric:        Mood and Affect: Mood normal.        Behavior: Behavior normal.        09/04/2023    9:54 AM 08/28/2022   10:12 AM 08/25/2021   10:28 AM  Advanced Directives  Does Patient Have a Medical Advance Directive? Yes Yes Yes  Type of Estate agent of [place];Living will Healthcare Power of [place];Living will Healthcare Power of [place];Living will  Does patient want to make changes to medical advance directive? No - Patient declined Yes (ED -  Information included in AVS)   Copy of Healthcare Power of Attorney in Chart? No - copy requested  No - copy requested    Current Medications (verified) Outpatient Encounter Medications as of 09/04/2023  Medication Sig   Amino Acids (AMINO ACID PO) Take by mouth.   ARMOUR THYROID 90 MG tablet TAKE 1 TABLET BY MOUTH EVERY DAY   Ascorbic Acid (VITAMIN C) 1000 MG tablet Take 1,000 mg by mouth daily.   CHELATED MAGNESIUM PO Take by mouth.   Cholecalciferol (VITAMIN D) 125 MCG (5000 UT) CAPS Take by mouth daily.   co-enzyme Q-10 30 MG capsule Take 30 mg by mouth 3 (three) times daily.   estradiol (ESTRACE) 2 MG tablet Take 2 mg by mouth.    glucosamine-chondroitin 500-400 MG tablet Take 1 tablet by mouth 3 (three) times daily.   Homeopathic Products (LIVER SUPPORT SL) Place under the tongue.   metroNIDAZOLE (METROCREAM) 0.75 % cream Use as directed twice daily   Omega-3 Fatty Acids (SALMON OIL-1000 PO) Take by mouth.   Probiotic Product (PROBIOTIC-10 PO) Take by mouth.   progesterone (PROMETRIUM) 200 MG capsule Take 1 capsule by mouth daily.   Testosterone 20 % CREA 1 applicator daily.   Turmeric (QC TUMERIC COMPLEX PO) Take by mouth.   vitamin B-12 (CYANOCOBALAMIN) 250 MCG tablet Take 250 mcg by mouth daily.   vitamin E 180 MG (400 UNITS) capsule Take by mouth.   Zinc Sulfate (ZINC-220 PO) Take by mouth.   No facility-administered encounter medications on file as of 09/04/2023.    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   Hypothyroidism    Hypothyroidism    Urticaria    Vitiligo    Past Surgical History:  Procedure Laterality Date   BREAST CYST ASPIRATION Left 37 yrs ago   PARTIAL HYSTERECTOMY  1993   TONSILLECTOMY     childhood   Family History  Problem Relation Age of Onset   Pancreatic cancer Mother    Aneurysm Father    Breast cancer Sister 16   Breast cancer Sister 35   Skin cancer Brother    Heart attack Brother    Hepatitis C Brother    Skin cancer Brother    Social History   Socioeconomic History   Marital status: Married    Spouse name: Not on file   Number of children: 2   Years of education: Not on file   Highest education level: Master's degree (e.g., MA, MS, MEng, MEd, MSW, MBA)  Occupational History   Not on file  Tobacco Use   Smoking status: Never   Smokeless tobacco: Never  Vaping Use   Vaping status: Never Used  Substance and Sexual Activity   Alcohol use: Yes    Alcohol/week: 14.0 standard drinks of alcohol    Types:  14 Glasses of wine per week   Drug use: Never   Sexual activity: Yes  Other Topics Concern   Not on file  Social History Narrative   Not on file   Social Drivers of Health   Financial Resource Strain: Low Risk  (09/04/2023)   Overall Financial Resource Strain (CARDIA)    Difficulty of Paying Living Expenses: Not hard at all  Food Insecurity: No Food Insecurity (09/04/2023)   Hunger Vital Sign    Worried About Running Out of  Food in the Last Year: Never true    Ran Out of Food in the Last Year: Never true  Transportation Needs: No Transportation Needs (09/04/2023)   PRAPARE - Administrator, Civil Service (Medical): No    Lack of Transportation (Non-Medical): No  Physical Activity: Insufficiently Active (09/04/2023)   Exercise Vital Sign    Days of Exercise per Week: 3 days    Minutes of Exercise per Session: 40 min  Stress: No Stress Concern Present (09/04/2023)   [employer] of Occupational Health - Occupational Stress Questionnaire    Feeling of Stress : Not at all  Social Connections: Moderately Isolated (09/04/2023)   Social Connection and Isolation Panel [NHANES]    Frequency of Communication with Friends and Family: More than three times a week    Frequency of Social Gatherings with Friends and Family: More than three times a week    Attends Religious Services: Never    Database administrator or Organizations: No    Attends Engineer, structural: Never    Marital Status: Married    Tobacco Counseling Counseling given: Not Answered   Clinical Intake:  Pre-visit preparation completed: No  Pain : No/denies pain     Nutritional Risks: None Diabetes: No  How often do you need to have someone help you when you read instructions, pamphlets, or other written materials from your doctor or pharmacy?: 1 - Never  Interpreter Needed?: No      Activities of Daily Living    09/03/2023    9:55 PM  In your present state of health, do you have any  difficulty performing the following activities:  Hearing? 0  Vision? 0  Difficulty concentrating or making decisions? 0  Walking or climbing stairs? 0  Dressing or bathing? 0  Doing errands, shopping? 0  Preparing Food and eating ? N  Using the Toilet? N  In the past six months, have you accidently leaked urine? N  Do you have problems with loss of bowel control? N  Managing your Medications? N  Managing your Finances? N  Housekeeping or managing your Housekeeping? N    Patient Care Team: CoxFritzi Mandes, MD as PCP - General (Internal Medicine)  Indicate any recent Medical Services you may have received from other than [facility] providers in the past year (date may be approximate).     Assessment:   This is a routine wellness examination for [place].  Hearing/Vision screen No results found.  Depression Screen    09/04/2023    9:55 AM 08/28/2022   10:12 AM 08/25/2021   10:29 AM 08/14/2018   10:36 AM  PHQ 2/9 Scores  PHQ - 2 Score 0 0 0 0    Fall Risk    09/03/2023    9:55 PM 08/24/2022   11:10 AM 08/25/2021   10:28 AM 08/23/2021    3:03 PM 08/21/2021    2:54 PM  Fall Risk   Falls in the past year? 1 0 0 0 0  Number falls in past yr: 0 0 0 0 0  Injury with Fall? 0 0 0 0 0  Risk for fall due to : No Fall Risks No Fall Risks No Fall Risks    Follow up Falls evaluation completed Falls evaluation completed       MEDICARE RISK AT HOME: Medicare Risk at Home Any stairs in or around the home?: (Patient-Rptd) Yes If so, are there any without handrails?: (Patient-Rptd) No Home free of loose throw  rugs in walkways, pet beds, electrical cords, etc?: (Patient-Rptd) Yes Adequate lighting in your home to reduce risk of falls?: (Patient-Rptd) Yes Life alert?: (Patient-Rptd) No Use of a cane, walker or w/c?: (Patient-Rptd) No Grab bars in the bathroom?: (Patient-Rptd) No Shower chair or bench in shower?: (Patient-Rptd) No Elevated toilet seat or a handicapped toilet?: (Patient-Rptd)  Yes  TIMED UP AND GO:  Was the test performed?  Yes  Length of time to ambulate 10 feet: 2 sec Gait steady and fast without use of assistive device    Cognitive Function:        08/25/2021   10:29 AM  6CIT Screen  What Year? 0 points  What month? 0 points  What time? 0 points  Count back from 20 0 points  Months in reverse 0 points  Repeat phrase 0 points  Total Score 0 points    Immunizations Immunization History  Administered Date(s) Administered   Pneumococcal Conjugate-13 01/21/2015   Pneumococcal Polysaccharide-23 02/22/2016   Tdap 12/08/2009    Screening Tests Health Maintenance  Topic Date Due   Zoster Vaccines- Shingrix (1 of 2) Never done   DTaP/Tdap/Td (2 - Td or Tdap) 12/09/2019   Colonoscopy  05/17/2022   COVID-19 Vaccine (1 - 2024-25 season) 09/20/2023 (Originally 03/11/2023)   Medicare Annual Wellness (AWV)  09/03/2024   Pneumonia Vaccine 86+ Years old  Completed   DEXA SCAN  Completed   Hepatitis C Screening  Completed   HPV VACCINES  Aged Out   INFLUENZA VACCINE  Discontinued    Health Maintenance  Health Maintenance Due  Topic Date Due   Zoster Vaccines- Shingrix (1 of 2) Never done   DTaP/Tdap/Td (2 - Td or Tdap) 12/09/2019   Colonoscopy  05/17/2022    Additional Screening:  Vision Screening: Recommended annual ophthalmology exams for early detection of glaucoma and other disorders of the eye. Is the patient up to date with their annual eye exam?  Yes  Who is the provider or what is the name of the office in which the patient attends annual eye exams?   Dental Screening: Recommended annual dental exams for proper oral hygiene   Community Resource Referral / Chronic Care Management: CRR required this visit?  No   CCM required this visit?  No     Plan:     I have personally reviewed and noted the following in the patient's chart:   Medical and social history Use of alcohol, tobacco or illicit drugs  Current medications and  supplements including opioid prescriptions. Patient is not currently taking opioid prescriptions. Functional ability and status Nutritional status Physical activity Advanced directives List of other physicians Hospitalizations, surgeries, and ER visits in previous 12 months Vitals Screenings to include cognitive, depression, and falls Referrals and appointments  In addition, I have reviewed and discussed with patient certain preventive protocols, quality metrics, and best practice recommendations. A written personalized care plan for preventive services as well as general preventive health recommendations were provided to patient.

## 2023-09-04 NOTE — Assessment & Plan Note (Signed)
 Management per specialist.  Request records from recent labs.

## 2023-09-04 NOTE — Assessment & Plan Note (Signed)
 Cologuard ordered

## 2023-09-17 DIAGNOSIS — Z1211 Encounter for screening for malignant neoplasm of colon: Secondary | ICD-10-CM | POA: Diagnosis not present

## 2023-09-22 LAB — COLOGUARD: COLOGUARD: NEGATIVE

## 2023-09-23 ENCOUNTER — Encounter: Payer: Self-pay | Admitting: Family Medicine

## 2024-02-19 DIAGNOSIS — L814 Other melanin hyperpigmentation: Secondary | ICD-10-CM | POA: Diagnosis not present

## 2024-02-19 DIAGNOSIS — L718 Other rosacea: Secondary | ICD-10-CM | POA: Diagnosis not present

## 2024-02-19 DIAGNOSIS — Z85828 Personal history of other malignant neoplasm of skin: Secondary | ICD-10-CM | POA: Diagnosis not present

## 2024-02-19 DIAGNOSIS — Z08 Encounter for follow-up examination after completed treatment for malignant neoplasm: Secondary | ICD-10-CM | POA: Diagnosis not present

## 2024-02-19 DIAGNOSIS — L821 Other seborrheic keratosis: Secondary | ICD-10-CM | POA: Diagnosis not present

## 2024-02-19 DIAGNOSIS — L578 Other skin changes due to chronic exposure to nonionizing radiation: Secondary | ICD-10-CM | POA: Diagnosis not present

## 2024-02-19 DIAGNOSIS — L80 Vitiligo: Secondary | ICD-10-CM | POA: Diagnosis not present

## 2024-03-13 DIAGNOSIS — Z1231 Encounter for screening mammogram for malignant neoplasm of breast: Secondary | ICD-10-CM | POA: Diagnosis not present

## 2024-03-13 DIAGNOSIS — R92333 Mammographic heterogeneous density, bilateral breasts: Secondary | ICD-10-CM | POA: Diagnosis not present

## 2024-05-28 DIAGNOSIS — Z961 Presence of intraocular lens: Secondary | ICD-10-CM | POA: Diagnosis not present

## 2024-05-28 DIAGNOSIS — H43811 Vitreous degeneration, right eye: Secondary | ICD-10-CM | POA: Diagnosis not present

## 2024-09-08 ENCOUNTER — Ambulatory Visit: Payer: Medicare HMO | Admitting: Family Medicine
